# Patient Record
Sex: Male | Born: 1965
Health system: Southern US, Community
[De-identification: ages and names within clinical notes are randomized; demographics above are authoritative.]

## PROBLEM LIST (undated history)

## (undated) DIAGNOSIS — E785 Hyperlipidemia, unspecified: Secondary | ICD-10-CM

## (undated) DIAGNOSIS — I10 Essential (primary) hypertension: Secondary | ICD-10-CM

## (undated) HISTORY — DX: Hyperlipidemia, unspecified: E78.5

## (undated) HISTORY — PX: BACK SURGERY: SHX140

## (undated) HISTORY — PX: KNEE SURGERY: SHX244

## (undated) HISTORY — PX: TONSILLECTOMY: SUR1361

---

## 1998-11-07 ENCOUNTER — Encounter: Payer: Self-pay | Admitting: Neurological Surgery

## 1998-11-11 ENCOUNTER — Ambulatory Visit (HOSPITAL_COMMUNITY): Admission: RE | Admit: 1998-11-11 | Discharge: 1998-11-11 | Payer: Self-pay | Admitting: Neurological Surgery

## 1998-11-11 ENCOUNTER — Encounter: Payer: Self-pay | Admitting: Neurological Surgery

## 2015-08-07 ENCOUNTER — Other Ambulatory Visit: Payer: Self-pay | Admitting: Orthopedic Surgery

## 2015-08-07 DIAGNOSIS — M545 Low back pain: Secondary | ICD-10-CM

## 2015-08-07 DIAGNOSIS — M79605 Pain in left leg: Secondary | ICD-10-CM

## 2015-08-14 ENCOUNTER — Ambulatory Visit
Admission: RE | Admit: 2015-08-14 | Discharge: 2015-08-14 | Disposition: A | Discharge status: Home / Self Care | Payer: 59 | Source: Ambulatory Visit | Attending: Orthopedic Surgery | Admitting: Orthopedic Surgery

## 2015-08-14 DIAGNOSIS — M79605 Pain in left leg: Secondary | ICD-10-CM

## 2015-08-14 DIAGNOSIS — M545 Low back pain: Secondary | ICD-10-CM

## 2018-02-14 DIAGNOSIS — M549 Dorsalgia, unspecified: Secondary | ICD-10-CM | POA: Diagnosis not present

## 2018-07-31 DIAGNOSIS — S43432A Superior glenoid labrum lesion of left shoulder, initial encounter: Secondary | ICD-10-CM | POA: Diagnosis not present

## 2018-10-17 DIAGNOSIS — S43432D Superior glenoid labrum lesion of left shoulder, subsequent encounter: Secondary | ICD-10-CM | POA: Diagnosis not present

## 2018-10-18 ENCOUNTER — Other Ambulatory Visit: Payer: Self-pay | Admitting: Orthopedic Surgery

## 2018-10-18 DIAGNOSIS — Z20828 Contact with and (suspected) exposure to other viral communicable diseases: Secondary | ICD-10-CM | POA: Diagnosis not present

## 2018-10-18 DIAGNOSIS — M25512 Pain in left shoulder: Secondary | ICD-10-CM

## 2018-10-18 DIAGNOSIS — G8929 Other chronic pain: Secondary | ICD-10-CM

## 2018-11-06 ENCOUNTER — Ambulatory Visit
Admission: RE | Admit: 2018-11-06 | Discharge: 2018-11-06 | Disposition: A | Discharge status: Home / Self Care | Payer: Self-pay | Source: Ambulatory Visit | Attending: Orthopedic Surgery | Admitting: Orthopedic Surgery

## 2018-11-06 ENCOUNTER — Ambulatory Visit
Admission: RE | Admit: 2018-11-06 | Discharge: 2018-11-06 | Disposition: A | Discharge status: Home / Self Care | Payer: BLUE CROSS/BLUE SHIELD | Source: Ambulatory Visit | Attending: Orthopedic Surgery | Admitting: Orthopedic Surgery

## 2018-11-06 ENCOUNTER — Other Ambulatory Visit: Payer: Self-pay

## 2018-11-06 DIAGNOSIS — G8929 Other chronic pain: Secondary | ICD-10-CM

## 2018-11-06 DIAGNOSIS — M25512 Pain in left shoulder: Secondary | ICD-10-CM | POA: Diagnosis not present

## 2018-11-06 DIAGNOSIS — S43432A Superior glenoid labrum lesion of left shoulder, initial encounter: Secondary | ICD-10-CM | POA: Diagnosis not present

## 2018-11-06 MED ORDER — IOPAMIDOL (ISOVUE-M 200) INJECTION 41%
15.0000 mL | Freq: Once | INTRAMUSCULAR | Status: AC
  Administered 2018-11-06: 15 mL via INTRA_ARTICULAR

## 2018-11-07 DIAGNOSIS — Z20828 Contact with and (suspected) exposure to other viral communicable diseases: Secondary | ICD-10-CM | POA: Diagnosis not present

## 2018-11-08 DIAGNOSIS — R05 Cough: Secondary | ICD-10-CM | POA: Diagnosis not present

## 2019-01-03 DIAGNOSIS — M9907 Segmental and somatic dysfunction of upper extremity: Secondary | ICD-10-CM | POA: Diagnosis not present

## 2019-01-03 DIAGNOSIS — M47892 Other spondylosis, cervical region: Secondary | ICD-10-CM | POA: Diagnosis not present

## 2019-01-03 DIAGNOSIS — M9902 Segmental and somatic dysfunction of thoracic region: Secondary | ICD-10-CM | POA: Diagnosis not present

## 2019-01-03 DIAGNOSIS — M9901 Segmental and somatic dysfunction of cervical region: Secondary | ICD-10-CM | POA: Diagnosis not present

## 2019-01-08 DIAGNOSIS — M9902 Segmental and somatic dysfunction of thoracic region: Secondary | ICD-10-CM | POA: Diagnosis not present

## 2019-01-08 DIAGNOSIS — M9901 Segmental and somatic dysfunction of cervical region: Secondary | ICD-10-CM | POA: Diagnosis not present

## 2019-01-08 DIAGNOSIS — M9907 Segmental and somatic dysfunction of upper extremity: Secondary | ICD-10-CM | POA: Diagnosis not present

## 2019-01-08 DIAGNOSIS — M47892 Other spondylosis, cervical region: Secondary | ICD-10-CM | POA: Diagnosis not present

## 2019-01-16 DIAGNOSIS — M9907 Segmental and somatic dysfunction of upper extremity: Secondary | ICD-10-CM | POA: Diagnosis not present

## 2019-01-16 DIAGNOSIS — M9902 Segmental and somatic dysfunction of thoracic region: Secondary | ICD-10-CM | POA: Diagnosis not present

## 2019-01-16 DIAGNOSIS — M47892 Other spondylosis, cervical region: Secondary | ICD-10-CM | POA: Diagnosis not present

## 2019-01-16 DIAGNOSIS — M9901 Segmental and somatic dysfunction of cervical region: Secondary | ICD-10-CM | POA: Diagnosis not present

## 2019-01-22 DIAGNOSIS — M9907 Segmental and somatic dysfunction of upper extremity: Secondary | ICD-10-CM | POA: Diagnosis not present

## 2019-01-22 DIAGNOSIS — M9902 Segmental and somatic dysfunction of thoracic region: Secondary | ICD-10-CM | POA: Diagnosis not present

## 2019-01-22 DIAGNOSIS — M47892 Other spondylosis, cervical region: Secondary | ICD-10-CM | POA: Diagnosis not present

## 2019-01-22 DIAGNOSIS — M9901 Segmental and somatic dysfunction of cervical region: Secondary | ICD-10-CM | POA: Diagnosis not present

## 2019-01-29 DIAGNOSIS — M9907 Segmental and somatic dysfunction of upper extremity: Secondary | ICD-10-CM | POA: Diagnosis not present

## 2019-01-29 DIAGNOSIS — M47892 Other spondylosis, cervical region: Secondary | ICD-10-CM | POA: Diagnosis not present

## 2019-01-29 DIAGNOSIS — M9901 Segmental and somatic dysfunction of cervical region: Secondary | ICD-10-CM | POA: Diagnosis not present

## 2019-01-29 DIAGNOSIS — M9902 Segmental and somatic dysfunction of thoracic region: Secondary | ICD-10-CM | POA: Diagnosis not present

## 2019-03-01 DIAGNOSIS — Z03818 Encounter for observation for suspected exposure to other biological agents ruled out: Secondary | ICD-10-CM | POA: Diagnosis not present

## 2019-03-10 ENCOUNTER — Other Ambulatory Visit: Payer: Self-pay

## 2019-03-10 ENCOUNTER — Encounter (HOSPITAL_COMMUNITY): Payer: Self-pay | Admitting: Emergency Medicine

## 2019-03-10 ENCOUNTER — Emergency Department (HOSPITAL_COMMUNITY)
Admission: EM | Admit: 2019-03-10 | Discharge: 2019-03-10 | Disposition: A | Discharge status: Home / Self Care | Payer: BC Managed Care – PPO | Attending: Emergency Medicine | Admitting: Emergency Medicine

## 2019-03-10 DIAGNOSIS — I10 Essential (primary) hypertension: Secondary | ICD-10-CM | POA: Insufficient documentation

## 2019-03-10 LAB — CBC WITH DIFFERENTIAL/PLATELET
Abs Immature Granulocytes: 0.01 10*3/uL (ref 0.00–0.07)
Basophils Absolute: 0 10*3/uL (ref 0.0–0.1)
Basophils Relative: 0 %
Eosinophils Absolute: 0.1 10*3/uL (ref 0.0–0.5)
Eosinophils Relative: 2 %
HCT: 43.4 % (ref 39.0–52.0)
Hemoglobin: 14.9 g/dL (ref 13.0–17.0)
Immature Granulocytes: 0 %
Lymphocytes Relative: 29 %
Lymphs Abs: 1.6 10*3/uL (ref 0.7–4.0)
MCH: 29.4 pg (ref 26.0–34.0)
MCHC: 34.3 g/dL (ref 30.0–36.0)
MCV: 85.6 fL (ref 80.0–100.0)
Monocytes Absolute: 0.6 10*3/uL (ref 0.1–1.0)
Monocytes Relative: 11 %
Neutro Abs: 3.2 10*3/uL (ref 1.7–7.7)
Neutrophils Relative %: 58 %
Platelets: 253 10*3/uL (ref 150–400)
RBC: 5.07 MIL/uL (ref 4.22–5.81)
RDW: 12.1 % (ref 11.5–15.5)
WBC: 5.4 10*3/uL (ref 4.0–10.5)
nRBC: 0 % (ref 0.0–0.2)

## 2019-03-10 LAB — COMPREHENSIVE METABOLIC PANEL
ALT: 20 U/L (ref 0–44)
AST: 17 U/L (ref 15–41)
Albumin: 4.7 g/dL (ref 3.5–5.0)
Alkaline Phosphatase: 59 U/L (ref 38–126)
Anion gap: 8 (ref 5–15)
BUN: 17 mg/dL (ref 6–20)
CO2: 27 mmol/L (ref 22–32)
Calcium: 9.4 mg/dL (ref 8.9–10.3)
Chloride: 104 mmol/L (ref 98–111)
Creatinine, Ser: 0.87 mg/dL (ref 0.61–1.24)
GFR calc Af Amer: >60 mL/min (ref >60)
GFR calc non Af Amer: >60 mL/min (ref >60)
Glucose, Bld: 101 mg/dL — ABNORMAL HIGH (ref 70–99)
Potassium: 4.2 mmol/L (ref 3.5–5.1)
Sodium: 139 mmol/L (ref 135–145)
Total Bilirubin: 0.4 mg/dL (ref 0.3–1.2)
Total Protein: 7.5 g/dL (ref 6.5–8.1)

## 2019-03-10 LAB — URINALYSIS, ROUTINE W REFLEX MICROSCOPIC
Bilirubin Urine: NEGATIVE
Glucose, UA: NEGATIVE mg/dL
Hgb urine dipstick: NEGATIVE
Ketones, ur: NEGATIVE mg/dL
Leukocytes,Ua: NEGATIVE
Nitrite: NEGATIVE
Protein, ur: NEGATIVE mg/dL
Specific Gravity, Urine: 1.023 (ref 1.005–1.030)
pH: 6 (ref 5.0–8.0)

## 2019-03-10 MED ORDER — AMLODIPINE BESYLATE 5 MG PO TABS: 5.0000 mg | ORAL_TABLET | Freq: Every day | ORAL | 1 refills | Status: DC

## 2019-03-10 NOTE — ED Provider Notes (Signed)
[facility] DEPT Provider Note   CSN: [id number] Arrival date & time: 03/10/19  1716     History Chief Complaint  Patient presents with  . Hypertension    Brent Reid is a 54 y.o. male.  HPI  54 year old male with hypertension.  He has had vague sensation of feeling well over the past 2 days to his blood pressure at home.  Numerous high readings up to 190s over 90s.  He reports increased stress over significant health issues with his wife.  He has been told in the past that his blood pressure was "borderline."  He has never been on History reviewed. No pertinent past medical history.  There are no problems to display for this patient.  History reviewed. No pertinent surgical history.    No family history on file.  Social History   Tobacco Use  . Smoking status: Not on file  Substance Use Topics  . Alcohol use: Not on file  . Drug use: Not on file   Home Medications Prior to Admission medications   Not on File   Allergies    Patient has no known allergies.  Review of Systems   Review of Systems All systems reviewed and negative, other than as noted in HPI.  Physical Exam Updated Vital Signs BP (!) 143/99   Pulse 82   Temp 98.5 F (36.9 C)   Resp 20   SpO2 98%   Physical Exam Vitals and nursing note reviewed.  Constitutional:      General: He is not in acute distress.    Appearance: He is well-developed.  HENT:     Head: Normocephalic and atraumatic.  Eyes:     General:        Right eye: No discharge.        Left eye: No discharge.     Conjunctiva/sclera: Conjunctivae normal.  Cardiovascular:     Rate and Rhythm: Normal rate and regular rhythm.     Heart sounds: Normal heart sounds. No murmur. No friction rub. No gallop.   Pulmonary:     Effort: Pulmonary effort is normal. No respiratory distress.     Breath sounds: Normal breath sounds.  Abdominal:     General: There is no distension.     Palpations: Abdomen  is soft.     Tenderness: There is no abdominal tenderness.  Musculoskeletal:        General: No tenderness.     Cervical back: Neck supple.  Skin:    General: Skin is warm and dry.  Neurological:     Mental Status: He is alert.  Psychiatric:        Behavior: Behavior normal.        Thought Content: Thought content normal.     ED Results / Procedures / Treatments   Labs (all labs ordered are listed, but only abnormal results are displayed) Labs Reviewed  COMPREHENSIVE METABOLIC PANEL - Abnormal; Notable for the following components:      Result Value   Glucose, Bld 101 (*)    All other components within normal limits  URINALYSIS, ROUTINE W REFLEX MICROSCOPIC - Abnormal; Notable for the following components:   APPearance CLOUDY (*)    All other components within normal limits  CBC WITH DIFFERENTIAL/PLATELET   EKG None  Radiology No results found.  Procedures Procedures (including critical care time)  Medications Ordered in ED Medications - No data to display  ED Course  I have reviewed the triage vital  signs and the nursing notes.  Pertinent labs & imaging results that were available during my care of the patient were reviewed by me and considered in my medical decision making (see chart for details).    MDM Rules/Calculators/A&P                     54 year old male with what is probably essential hypertension.  Essentially symptomatic from it.  He has a lot of questions but they are really more in line of health maintenance.  Needs to establish with a PCP.  Will start on a low-dose of Norvasc for now.    Final Clinical Impression(s) / ED Diagnoses Final diagnoses:  Hypertension, unspecified type    Rx / DC Orders ED Discharge Orders    None       Raeford Razor, MD 03/13/19 [phone]

## 2019-03-10 NOTE — ED Triage Notes (Signed)
Patient reports head pressure x2 days. States he took BP at home with high readings (192/82). BP 143/99 in triage.

## 2019-03-12 ENCOUNTER — Emergency Department (HOSPITAL_COMMUNITY)
Admission: EM | Admit: 2019-03-12 | Discharge: 2019-03-12 | Disposition: A | Discharge status: Home / Self Care | Payer: BC Managed Care – PPO | Attending: Emergency Medicine | Admitting: Emergency Medicine

## 2019-03-12 ENCOUNTER — Other Ambulatory Visit: Payer: Self-pay

## 2019-03-12 ENCOUNTER — Encounter (HOSPITAL_COMMUNITY): Payer: Self-pay

## 2019-03-12 ENCOUNTER — Emergency Department (HOSPITAL_COMMUNITY): Payer: BC Managed Care – PPO

## 2019-03-12 DIAGNOSIS — R0789 Other chest pain: Secondary | ICD-10-CM | POA: Insufficient documentation

## 2019-03-12 DIAGNOSIS — R079 Chest pain, unspecified: Secondary | ICD-10-CM | POA: Diagnosis not present

## 2019-03-12 DIAGNOSIS — I160 Hypertensive urgency: Secondary | ICD-10-CM | POA: Diagnosis not present

## 2019-03-12 DIAGNOSIS — R519 Headache, unspecified: Secondary | ICD-10-CM | POA: Insufficient documentation

## 2019-03-12 DIAGNOSIS — R270 Ataxia, unspecified: Secondary | ICD-10-CM | POA: Diagnosis not present

## 2019-03-12 DIAGNOSIS — I10 Essential (primary) hypertension: Secondary | ICD-10-CM | POA: Diagnosis not present

## 2019-03-12 HISTORY — DX: Essential (primary) hypertension: I10

## 2019-03-12 LAB — CBC
HCT: 43.8 % (ref 39.0–52.0)
Hemoglobin: 15 g/dL (ref 13.0–17.0)
MCH: 29.4 pg (ref 26.0–34.0)
MCHC: 34.2 g/dL (ref 30.0–36.0)
MCV: 85.9 fL (ref 80.0–100.0)
Platelets: 236 10*3/uL (ref 150–400)
RBC: 5.1 MIL/uL (ref 4.22–5.81)
RDW: 12.2 % (ref 11.5–15.5)
WBC: 4.2 10*3/uL (ref 4.0–10.5)
nRBC: 0 % (ref 0.0–0.2)

## 2019-03-12 LAB — BASIC METABOLIC PANEL
Anion gap: 8 (ref 5–15)
BUN: 13 mg/dL (ref 6–20)
CO2: 27 mmol/L (ref 22–32)
Calcium: 9.4 mg/dL (ref 8.9–10.3)
Chloride: 105 mmol/L (ref 98–111)
Creatinine, Ser: 0.83 mg/dL (ref 0.61–1.24)
GFR calc Af Amer: >60 mL/min (ref >60)
GFR calc non Af Amer: >60 mL/min (ref >60)
Glucose, Bld: 105 mg/dL — ABNORMAL HIGH (ref 70–99)
Potassium: 4.1 mmol/L (ref 3.5–5.1)
Sodium: 140 mmol/L (ref 135–145)

## 2019-03-12 LAB — TROPONIN I (HIGH SENSITIVITY): Troponin I (High Sensitivity): 3 ng/L (ref <18)

## 2019-03-12 LAB — TSH: TSH: 1.477 u[IU]/mL (ref 0.350–4.500)

## 2019-03-12 MED ORDER — SODIUM CHLORIDE 0.9% FLUSH: 3.0000 mL | Freq: Once | INTRAVENOUS | Status: DC

## 2019-03-12 NOTE — ED Provider Notes (Signed)
Blood pressure (!) 145/91, pulse 72, temperature 98 F (36.7 C), temperature source Oral, resp. rate 16, height 6\' 3"  (1.905 m), weight 102.1 kg, SpO2 100 %.  Assuming care from Dr. .  In short, Brent Reid is a 54 y.o. male with a chief complaint of Chest Pain and Hypertension .  Refer to the original H&P for additional details.  The current plan of care is to f/u on CT head and TSH. Anticipate discharge with Cardiology follow up plan Referral placed by Dr. 44.   06:34 PM   CT head and TSH have resulted with no acute findings.  Discussed the findings with the patient and placed an ambulatory referral to cardiology.  Patient understands the follow-up plan and ED return precautions.    Judd Lien, MD 03/12/19 [phone]

## 2019-03-12 NOTE — ED Notes (Signed)
Pt verbalizes understanding of DC instructions. Pt belongings returned and is ambulatory out of ED.  

## 2019-03-12 NOTE — ED Notes (Signed)
This RN called CT to see ETA for exam

## 2019-03-12 NOTE — ED Triage Notes (Signed)
Patient reports intermittent chest pain and hypertension x 1 week. Patient reports his BP at home was 215/125. BP in triage- 153/101. Patient was seen recently for hypertension and ws started on new HTN meds.

## 2019-03-12 NOTE — Discharge Instructions (Addendum)
Continue medication as previously prescribed.  Keep a record of your blood pressures at home and take this with you to your next doctor's appointment.  A referral has been placed for you to see cardiology.  You can call the clinic tomorrow to follow-up on these arrangements.  Their contact information has been provided in this discharge summary.  Return to the emergency department if your symptoms significantly worsen or change.

## 2019-03-12 NOTE — ED Notes (Signed)
Pt transported to CT ?

## 2019-03-12 NOTE — ED Notes (Signed)
This RN called CT to see ETA for exam  

## 2019-03-12 NOTE — ED Provider Notes (Signed)
[facility] DEPT Provider Note   CSN: [id number] Arrival date & time: 03/12/19  1150     History Chief Complaint  Patient presents with  . Chest Pain  . Hypertension    Brent Reid is a 54 y.o. male.  Patient is a 54 year old male presenting with complaints of elevated blood pressure, pressure behind his eyes, fatigue, and feeling generally unwell for the past week.  Patient seen here 2 days ago with similar complaints.  Work-up did not show was unremarkable and the patient was discharged to home.  He was started on Norvasc which she has had 2 doses of.  His blood pressure at home today was 215/125 and he reports developing the pressure behind his eyes again.  He presents for evaluation of this.  He denies chest pain, but does describe some discomfort in his left shoulder and arm.  He also has some discomfort in his lower back.  He denies any fevers or chills.  He denies any aggravating or alleviating factors.  The history is provided by the patient.       Past Medical History:  Diagnosis Date  . Hypertension     There are no problems to display for this patient.   Past Surgical History:  Procedure Laterality Date  . BACK SURGERY    . KNEE SURGERY         Family History  Problem Relation Age of Onset  . Stroke Mother   . Heart failure Father     Social History   Tobacco Use  . Smoking status: Never Smoker  . Smokeless tobacco: Never Used  Substance Use Topics  . Alcohol use: Yes  . Drug use: Never    Home Medications Prior to Admission medications   Medication Sig Start Date End Date Taking? Authorizing Provider  amLODipine (NORVASC) 5 MG tablet Take 1 tablet (5 mg total) by mouth daily. 03/10/19   Virgel Manifold, MD    Allergies    Patient has no allergy information on record.  Review of Systems   Review of Systems  All other systems reviewed and are negative.   Physical Exam Updated Vital Signs BP (!) 153/101  (BP Location: Left Arm)   Pulse 72   Temp 98 F (36.7 C) (Oral)   Resp 16   Ht 6\' 3"  (1.905 m)   Wt 102.1 kg   SpO2 100%   BMI 28.12 kg/m   Physical Exam Vitals and nursing note reviewed.  Constitutional:      General: He is not in acute distress.    Appearance: He is well-developed. He is not diaphoretic.  HENT:     Head: Normocephalic and atraumatic.  Eyes:     Extraocular Movements: Extraocular movements intact.     Pupils: Pupils are equal, round, and reactive to light.  Cardiovascular:     Rate and Rhythm: Normal rate and regular rhythm.     Heart sounds: No murmur. No friction rub.  Pulmonary:     Effort: Pulmonary effort is normal. No respiratory distress.     Breath sounds: Normal breath sounds. No wheezing or rales.  Abdominal:     General: Bowel sounds are normal. There is no distension.     Palpations: Abdomen is soft.     Tenderness: There is no abdominal tenderness.  Musculoskeletal:        General: Normal range of motion.     Cervical back: Normal range of motion and neck supple.  Right lower leg: No tenderness. No edema.     Left lower leg: No tenderness. No edema.  Skin:    General: Skin is warm and dry.  Neurological:     General: No focal deficit present.     Mental Status: He is alert and oriented to person, place, and time.     Cranial Nerves: No cranial nerve deficit.     Motor: No weakness.     Coordination: Coordination normal.     ED Results / Procedures / Treatments   Labs (all labs ordered are listed, but only abnormal results are displayed) Labs Reviewed  BASIC METABOLIC PANEL - Abnormal; Notable for the following components:      Result Value   Glucose, Bld 105 (*)    All other components within normal limits  CBC  TSH  TROPONIN I (HIGH SENSITIVITY)    EKG EKG Interpretation  Date/Time:  Monday March 12 2019 12:11:36 EST Ventricular Rate:  69 PR Interval:    QRS Duration: 89 QT Interval:  368 QTC Calculation: 395 R  Axis:   68 Text Interpretation: Sinus rhythm Normal ECG Confirmed by Geoffery Lyons ([id number]) on 03/12/2019 12:26:59 PM   Radiology DG Chest 2 View  Result Date: 03/12/2019 CLINICAL DATA:  Chest pain, hypertension for 1 week EXAM: CHEST - 2 VIEW COMPARISON:  None. FINDINGS: The heart size and mediastinal contours are within normal limits. Both lungs are clear. The visualized skeletal structures are unremarkable. IMPRESSION: No active cardiopulmonary disease. Electronically Signed   By: Elige Ko   On: 03/12/2019 14:03    Procedures Procedures (including critical care time)  Medications Ordered in ED Medications - No data to display  ED Course  I have reviewed the triage vital signs and the nursing notes.  Pertinent labs & imaging results that were available during my care of the patient were reviewed by me and considered in my medical decision making (see chart for details).    MDM Rules/Calculators/A&P  Patient presenting here with complaints of elevated blood pressure, pressure in his head, and discomfort in his left shoulder and arm.  His blood pressure was markedly elevated at home this afternoon and presents for evaluation of this.  His blood pressures have improved significantly here in the ER.  Patient's work-up today shows unremarkable laboratory studies and EKG which is unchanged.  Chest x-ray is clear.  As the patient is complaining of ongoing headaches, a CT scan will be obtained to rule out intracranial pathology.  If the CT scan is negative, I anticipate discharge with outpatient follow-up.  Care to be signed out to Dr. Jacqulyn Bath at shift change.  He will obtain the results of the CT scan and determine the final disposition.  It sounds as though patient has had significant stressors in his life including his wife having stage 4 cancer.  This may play a role in his headaches and elevated blood pressure, however I do feel as though a follow-up appointment with cardiology would be  appropriate to discuss his blood pressures, possible echocardiogram, and possible stress testing.  Final Clinical Impression(s) / ED Diagnoses Final diagnoses:  None    Rx / DC Orders ED Discharge Orders    None       Geoffery Lyons, MD 03/12/19 1510

## 2019-03-16 ENCOUNTER — Encounter: Payer: Self-pay | Admitting: Family Medicine

## 2019-03-16 ENCOUNTER — Other Ambulatory Visit: Payer: Self-pay

## 2019-03-16 ENCOUNTER — Ambulatory Visit (INDEPENDENT_AMBULATORY_CARE_PROVIDER_SITE_OTHER): Payer: BC Managed Care – PPO | Admitting: Family Medicine

## 2019-03-16 VITALS — BP 124/86 | HR 77 | Temp 97.0°F | Ht 75.0 in | Wt 231.2 lb

## 2019-03-16 DIAGNOSIS — E559 Vitamin D deficiency, unspecified: Secondary | ICD-10-CM

## 2019-03-16 DIAGNOSIS — Z0001 Encounter for general adult medical examination with abnormal findings: Secondary | ICD-10-CM | POA: Diagnosis not present

## 2019-03-16 DIAGNOSIS — F439 Reaction to severe stress, unspecified: Secondary | ICD-10-CM

## 2019-03-16 DIAGNOSIS — Z6828 Body mass index (BMI) 28.0-28.9, adult: Secondary | ICD-10-CM

## 2019-03-16 DIAGNOSIS — I10 Essential (primary) hypertension: Secondary | ICD-10-CM | POA: Diagnosis not present

## 2019-03-16 DIAGNOSIS — E785 Hyperlipidemia, unspecified: Secondary | ICD-10-CM

## 2019-03-16 MED ORDER — AMLODIPINE BESYLATE 5 MG PO TABS: 5.0000 mg | ORAL_TABLET | Freq: Every day | ORAL | 3 refills | Status: DC

## 2019-03-16 NOTE — Patient Instructions (Signed)
It was very nice to see you today!  Keep an eye on your BP and let me know if persistently 140/90 or higher.  Come back in 3-6 months for a recheck.  Take care, Dr Jerline Pain  Please try these tips to maintain a healthy lifestyle:   Eat at least 3 REAL meals and 1-2 snacks per day.  Aim for no more than 5 hours between eating.  If you eat breakfast, please do so within one hour of getting up.    Each meal should contain half fruits/vegetables, one quarter protein, and one quarter carbs (no bigger than a computer mouse)   Cut down on sweet beverages. This includes juice, soda, and sweet tea.     Drink at least 1 glass of water with each meal and aim for at least 8 glasses per day   Exercise at least 150 minutes every week.    Preventive Care 90-9 Years Old, Male Preventive care refers to lifestyle choices and visits with your health care provider that can promote health and wellness. This includes:  A yearly physical exam. This is also called an annual well check.  Regular dental and eye exams.  Immunizations.  Screening for certain conditions.  Healthy lifestyle choices, such as eating a healthy diet, getting regular exercise, not using drugs or products that contain nicotine and tobacco, and limiting alcohol use. What can I expect for my preventive care visit? Physical exam Your health care provider will check:  Height and weight. These may be used to calculate body mass index (BMI), which is a measurement that tells if you are at a healthy weight.  Heart rate and blood pressure.  Your skin for abnormal spots. Counseling Your health care provider may ask you questions about:  Alcohol, tobacco, and drug use.  Emotional well-being.  Home and relationship well-being.  Sexual activity.  Eating habits.  Work and work Statistician. What immunizations do I need?  Influenza (flu) vaccine  This is recommended every year. Tetanus, diphtheria, and pertussis  (Tdap) vaccine  You may need a Td booster every 10 years. Varicella (chickenpox) vaccine  You may need this vaccine if you have not already been vaccinated. Zoster (shingles) vaccine  You may need this after age 32. Measles, mumps, and rubella (MMR) vaccine  You may need at least one dose of MMR if you were born in 1957 or later. You may also need a second dose. Pneumococcal conjugate (PCV13) vaccine  You may need this if you have certain conditions and were not previously vaccinated. Pneumococcal polysaccharide (PPSV23) vaccine  You may need one or two doses if you smoke cigarettes or if you have certain conditions. Meningococcal conjugate (MenACWY) vaccine  You may need this if you have certain conditions. Hepatitis A vaccine  You may need this if you have certain conditions or if you travel or work in places where you may be exposed to hepatitis A. Hepatitis B vaccine  You may need this if you have certain conditions or if you travel or work in places where you may be exposed to hepatitis B. Haemophilus influenzae type b (Hib) vaccine  You may need this if you have certain risk factors. Human papillomavirus (HPV) vaccine  If recommended by your health care provider, you may need three doses over 6 months. You may receive vaccines as individual doses or as more than one vaccine together in one shot (combination vaccines). Talk with your health care provider about the risks and benefits of combination vaccines.  What tests do I need? Blood tests  Lipid and cholesterol levels. These may be checked every 5 years, or more frequently if you are over 15 years old.  Hepatitis C test.  Hepatitis B test. Screening  Lung cancer screening. You may have this screening every year starting at age 53 if you have a 30-pack-year history of smoking and currently smoke or have quit within the past 15 years.  Prostate cancer screening. Recommendations will vary depending on your family  history and other risks.  Colorectal cancer screening. All adults should have this screening starting at age 38 and continuing until age 68. Your health care provider may recommend screening at age 29 if you are at increased risk. You will have tests every 1-10 years, depending on your results and the type of screening test.  Diabetes screening. This is done by checking your blood sugar (glucose) after you have not eaten for a while (fasting). You may have this done every 1-3 years.  Sexually transmitted disease (STD) testing. Follow these instructions at home: Eating and drinking  Eat a diet that includes fresh fruits and vegetables, whole grains, lean protein, and low-fat dairy products.  Take vitamin and mineral supplements as recommended by your health care provider.  Do not drink alcohol if your health care provider tells you not to drink.  If you drink alcohol: ? Limit how much you have to 0-2 drinks a day. ? Be aware of how much alcohol is in your drink. In the [place] one drink equals one 12 oz bottle of beer (355 mL), one 5 oz glass of wine (148 mL), or one 1 oz glass of hard liquor (44 mL). Lifestyle  Take daily care of your teeth and gums.  Stay active. Exercise for at least 30 minutes on 5 or more days each week.  Do not use any products that contain nicotine or tobacco, such as cigarettes, e-cigarettes, and chewing tobacco. If you need help quitting, ask your health care provider.  If you are sexually active, practice safe sex. Use a condom or other form of protection to prevent STIs (sexually transmitted infections).  Talk with your health care provider about taking a low-dose aspirin every day starting at age 34. What's next?  Go to your health care provider once a year for a well check visit.  Ask your health care provider how often you should have your eyes and teeth checked.  Stay up to date on all vaccines. This information is not intended to replace advice  given to you by your health care provider. Make sure you discuss any questions you have with your health care provider. Document Revised: 01/12/2018 Document Reviewed: 01/12/2018 Elsevier Patient Education  2020 [employer].

## 2019-03-16 NOTE — Progress Notes (Signed)
Chief Complaint:  Brent Reid is a 54 y.o. male who presents today for his annual comprehensive physical exam.    Assessment/Plan:  Chronic Problems Addressed Today: Essential hypertension At goal today.  Likely elevated due to increased stress and decreased cardio exercises.  We will continue amlodipine 5 mg daily for the next 2 months.  He will check in with me in 3 to 6 months.  Continue home monitoring goal 140/90 or lower.  Discussed reasons to return to care.  Stress Discussed options including therapy and pharmacotherapy.  Gave pamphlet with information for counselor in our office.  He would like to decline pharmacotherapy at this point.  We will follow-up with him again in 3 months.  Dyslipidemia Will scan most recent labs in the chart.  Had slightly elevated LDL and normal HDL.  He will continue with lifestyle modifications and try to work on cardio.  We will recheck in 1 year.   Body mass index is 28.9 kg/m. / Overweight BMI Metric Follow Up - 03/16/19 1404      BMI Metric Follow Up-Please document annually   BMI Metric Follow Up  Education provided        Preventative Healthcare: Deferred flu vaccine.  PSA within normal limits.  Patient Counseling(The following topics were reviewed and/or handout was given):  -Nutrition: Stressed importance of moderation in sodium/caffeine intake, saturated fat and cholesterol, caloric balance, sufficient intake of fresh fruits, vegetables, and fiber.  -Stressed the importance of regular exercise.   -Substance Abuse: Discussed cessation/primary prevention of tobacco, alcohol, or other drug use; driving or other dangerous activities under the influence; availability of treatment for abuse.   -Injury prevention: Discussed safety belts, safety helmets, smoke detector, smoking near bedding or upholstery.   -Sexuality: Discussed sexually transmitted diseases, partner selection, use of condoms, avoidance of unintended pregnancy and  contraceptive alternatives.   -Dental health: Discussed importance of regular tooth brushing, flossing, and dental visits.  -Health maintenance and immunizations reviewed. Please refer to Health maintenance section.  Return to care in 1 year for next preventative visit.     Subjective:  HPI:  He has no acute complaints today.   Patient was seen in the ED twice within the last week.  He has been under quite a bit of stress recently and was found to have elevated blood pressure reading.  He had extensive work-up there that was negative for cardiac etiology.  He was started on amlodipine 5 mg daily.  He has tolerated well.  He has been under increased rest recently.  Wife has been diagnosed with stage IV breast cancer.  Does not currently have any sort of chest pain or shortness of breath.  Lifestyle Diet: Tries to eat a healthy and balanced diet.  Exercise: Plays competitive volleyball. Works out in home gym.   No flowsheet data found.  Health Maintenance Due  Topic Date Due  . HIV Screening  08/06[birth year]1982     ROS: Per HPI, otherwise a complete review of systems was negative.   PMH:  The following were reviewed and entered/updated in epic: Past Medical History:  Diagnosis Date  . Hypertension    Patient Active Problem List   Diagnosis Date Noted  . Essential hypertension 03/16/2019  . Stress 03/16/2019  . Vitamin D deficiency 03/16/2019  . Dyslipidemia 03/16/2019   Past Surgical History:  Procedure Laterality Date  . BACK SURGERY    . KNEE SURGERY    . TONSILLECTOMY      Family  History  Problem Relation Age of Onset  . Stroke Mother   . Heart failure Father     Medications- reviewed and updated Current Outpatient Medications  Medication Sig Dispense Refill  . amLODipine (NORVASC) 5 MG tablet Take 1 tablet (5 mg total) by mouth daily. 90 tablet 3  . diclofenac (VOLTAREN) 50 MG EC tablet Take 50 mg by mouth 2 (two) times daily as needed for pain.     No current  facility-administered medications for this visit.    Allergies-reviewed and updated No Known Allergies  Social History   Socioeconomic History  . Marital status: Married    Spouse name: Not on file  . Number of children: Not on file  . Years of education: Not on file  . Highest education level: Not on file  Occupational History  . Not on file  Tobacco Use  . Smoking status: Never Smoker  . Smokeless tobacco: Never Used  Substance and Sexual Activity  . Alcohol use: Yes  . Drug use: Never  . Sexual activity: Not on file  Other Topics Concern  . Not on file  Social History Narrative  . Not on file   Social Determinants of Health   Financial Resource Strain:   . Difficulty of Paying Living Expenses: Not on file  Food Insecurity:   . Worried About Programme researcher, broadcasting/film/video in the Last Year: Not on file  . Ran Out of Food in the Last Year: Not on file  Transportation Needs:   . Lack of Transportation (Medical): Not on file  . Lack of Transportation (Non-Medical): Not on file  Physical Activity:   . Days of Exercise per Week: Not on file  . Minutes of Exercise per Session: Not on file  Stress:   . Feeling of Stress : Not on file  Social Connections:   . Frequency of Communication with Friends and Family: Not on file  . Frequency of Social Gatherings with Friends and Family: Not on file  . Attends Religious Services: Not on file  . Active Member of Clubs or Organizations: Not on file  . Attends Banker Meetings: Not on file  . Marital Status: Not on file        Objective:  Physical Exam: BP 124/86   Pulse 77   Temp (!) 97 F (36.1 C)   Ht 6\' 3"  (1.905 m)   Wt 231 lb 3.2 oz (104.9 kg)   SpO2 96%   BMI 28.90 kg/m   Body mass index is 28.9 kg/m. Wt Readings from Last 3 Encounters:  03/16/19 231 lb 3.2 oz (104.9 kg)  03/12/19 225 lb (102.1 kg)  03/10/19 225 lb (102.1 kg)   Gen: NAD, resting comfortably HEENT: TMs normal bilaterally. OP clear. No  thyromegaly noted.  CV: RRR with no murmurs appreciated Pulm: NWOB, CTAB with no crackles, wheezes, or rhonchi GI: Normal bowel sounds present. Soft, Nontender, Nondistended. MSK: no edema, cyanosis, or clubbing noted Skin: warm, dry Neuro: CN2-12 grossly intact. Strength 5/5 in upper and lower extremities. Reflexes symmetric and intact bilaterally.  Psych: Normal affect and thought content     Bronnie Vasseur M. 05/08/19, MD 03/16/2019 2:04 PM

## 2019-03-16 NOTE — Assessment & Plan Note (Signed)
At goal today.  Likely elevated due to increased stress and decreased cardio exercises.  We will continue amlodipine 5 mg daily for the next 2 months.  He will check in with me in 3 to 6 months.  Continue home monitoring goal 140/90 or lower.  Discussed reasons to return to care.

## 2019-03-16 NOTE — Assessment & Plan Note (Signed)
Will scan most recent labs in the chart.  Had slightly elevated LDL and normal HDL.  He will continue with lifestyle modifications and try to work on cardio.  We will recheck in 1 year.

## 2019-03-16 NOTE — Assessment & Plan Note (Signed)
Discussed options including therapy and pharmacotherapy.  Gave pamphlet with information for counselor in our office.  He would like to decline pharmacotherapy at this point.  We will follow-up with him again in 3 months.

## 2019-03-21 NOTE — Progress Notes (Signed)
Daisey Must, MD Reason for referral-hypertension  HPI: 54 year old male for evaluation of hypertension at request of Alona Bene, MD. Patient seen in the emergency room February 8 with complaints of hypertension.  Blood pressure at home reported as 215/125.  Head CT and chest x-ray negative.  TSH normal.  Sodium 140 with potassium 4.1.  BUN 13 and creatinine 0.83.  Patient has significant amounts of stress related to an illness of his wife.  When he was seen in the emergency room he developed increased blood pressure of 215/200 initially.  He denies dyspnea on exertion, orthopnea, PND, pedal edema, exertional chest pain or syncope.  He was placed on amlodipine 5 mg daily.  However he has had what he feels is side effects including flushing sensation and ankle edema.  Cardiology now asked to evaluate.  Current Outpatient Medications  Medication Sig Dispense Refill  . diclofenac (VOLTAREN) 50 MG EC tablet Take 50 mg by mouth 2 (two) times daily as needed for pain.     No current facility-administered medications for this visit.    No Known Allergies   Past Medical History:  Diagnosis Date  . Hyperlipidemia   . Hypertension     Past Surgical History:  Procedure Laterality Date  . BACK SURGERY    . KNEE SURGERY    . TONSILLECTOMY      Social History   Socioeconomic History  . Marital status: Married    Spouse name: Not on file  . Number of children: 1  . Years of education: Not on file  . Highest education level: Not on file  Occupational History    Comment: Real Estate  Tobacco Use  . Smoking status: Never Smoker  . Smokeless tobacco: Never Used  Substance and Sexual Activity  . Alcohol use: Yes    Comment: Occasional  . Drug use: Never  . Sexual activity: Not on file  Other Topics Concern  . Not on file  Social History Narrative  . Not on file   Social Determinants of Health   Financial Resource Strain:   . Difficulty of Paying Living Expenses: Not  on file  Food Insecurity:   . Worried About Programme researcher, broadcasting/film/video in the Last Year: Not on file  . Ran Out of Food in the Last Year: Not on file  Transportation Needs:   . Lack of Transportation (Medical): Not on file  . Lack of Transportation (Non-Medical): Not on file  Physical Activity:   . Days of Exercise per Week: Not on file  . Minutes of Exercise per Session: Not on file  Stress:   . Feeling of Stress : Not on file  Social Connections:   . Frequency of Communication with Friends and Family: Not on file  . Frequency of Social Gatherings with Friends and Family: Not on file  . Attends Religious Services: Not on file  . Active Member of Clubs or Organizations: Not on file  . Attends Banker Meetings: Not on file  . Marital Status: Not on file  Intimate Partner Violence:   . Fear of Current or Ex-Partner: Not on file  . Emotionally Abused: Not on file  . Physically Abused: Not on file  . Sexually Abused: Not on file    Family History  Problem Relation Age of Onset  . Stroke Mother   . Heart failure Father   . CAD Father     ROS: no fevers or chills, productive cough, hemoptysis, dysphasia, odynophagia, melena,  hematochezia, dysuria, hematuria, rash, seizure activity, orthopnea, PND, pedal edema, claudication. Remaining systems are negative.  Physical Exam:   Blood pressure (!) 148/82, pulse 73, height 6\' 3"  (1.905 m), weight 232 lb 6.4 oz (105.4 kg), SpO2 98 %.  General:  Well developed/well nourished in NAD Skin warm/dry Patient not depressed No peripheral clubbing Back-normal HEENT-normal/normal eyelids Neck supple/normal carotid upstroke bilaterally; no bruits; no JVD; no thyromegaly chest - CTA/ normal expansion CV - RRR/normal S1 and S2; no murmurs, rubs or gallops;  PMI nondisplaced Abdomen -NT/ND, no HSM, no mass, + bowel sounds, no bruit 2+ femoral pulses, no bruits Ext-no edema, chords, 2+ DP Neuro-grossly nonfocal  ECG - 03/12/19-sinus  rhythm with no ST changes.  Personally reviewed  A/P  1 hypertension-recent diagnosis of hypertension.  He is not tolerating amlodipine.  We will add losartan 50 mg daily and discontinue amlodipine.  Check potassium and renal function in 1 week.  Further adjustments based on follow-up readings.  2 hyperlipidemia-he had recent lipids drawn and we will review.  Initiate therapy as indicated.  3 family history of coronary artery disease-we will arrange a calcium score for risk stratification.  Kirk Ruths, MD

## 2019-03-23 ENCOUNTER — Telehealth: Payer: Self-pay | Admitting: Family Medicine

## 2019-03-23 NOTE — Telephone Encounter (Signed)
Rx sent patient please check with pharmacy

## 2019-03-23 NOTE — Telephone Encounter (Signed)
MEDICATION: amlodipine 5 MG tablet  PHARMACY: [facility] [address] at [facility] [address]  Comments: pt said he is on his last pill today  **Let patient know to contact pharmacy at the end of the day to make sure medication is ready. **  ** Please notify patient to allow 48-72 hours to process**  **Encourage patient to contact the pharmacy for refills or they can request refills through [facility]**

## 2019-03-27 ENCOUNTER — Encounter: Payer: Self-pay | Admitting: Cardiology

## 2019-03-27 ENCOUNTER — Ambulatory Visit (INDEPENDENT_AMBULATORY_CARE_PROVIDER_SITE_OTHER): Payer: BC Managed Care – PPO | Admitting: Cardiology

## 2019-03-27 ENCOUNTER — Other Ambulatory Visit: Payer: Self-pay

## 2019-03-27 VITALS — BP 148/82 | HR 73 | Ht 75.0 in | Wt 232.4 lb

## 2019-03-27 DIAGNOSIS — I10 Essential (primary) hypertension: Secondary | ICD-10-CM

## 2019-03-27 DIAGNOSIS — E785 Hyperlipidemia, unspecified: Secondary | ICD-10-CM

## 2019-03-27 MED ORDER — LOSARTAN POTASSIUM 50 MG PO TABS: 50.0000 mg | ORAL_TABLET | Freq: Every day | ORAL | 3 refills | Status: DC

## 2019-03-27 NOTE — Patient Instructions (Signed)
Medication Instructions:  STOP AMLODIPINE   START LOSARTAN 50 MG ONCE DAILY  *If you need a refill on your cardiac medications before your next appointment, please call your pharmacy*  Lab Work: Your physician recommends that you return for lab work in: ONE WEEK  If you have labs (blood work) drawn today and your tests are completely normal, you will receive your results only by: Marland Kitchen MyChart Message (if you have MyChart) OR . A paper copy in the mail If you have any lab test that is abnormal or we need to change your treatment, we will call you to review the results.  Testing/Procedures: CORONARY CALCIUM SCORE AT [address]  Follow-Up: At [facility], you and your health needs are our priority.  As part of our continuing mission to provide you with exceptional heart care, we have created designated Provider Care Teams.  These Care Teams include your primary Cardiologist (physician) and Advanced Practice Providers (APPs -  Physician Assistants and Nurse Practitioners) who all work together to provide you with the care you need, when you need it.  Your next appointment:   3 month(s)  The format for your next appointment:   Either In Person or Virtual  Provider:   Olga Millers, MD

## 2019-04-06 DIAGNOSIS — I10 Essential (primary) hypertension: Secondary | ICD-10-CM | POA: Diagnosis not present

## 2019-04-06 LAB — BASIC METABOLIC PANEL
BUN/Creatinine Ratio: 17 (ref 9–20)
BUN: 14 mg/dL (ref 6–24)
CO2: 24 mmol/L (ref 20–29)
Calcium: 9.8 mg/dL (ref 8.7–10.2)
Chloride: 101 mmol/L (ref 96–106)
Creatinine, Ser: 0.83 mg/dL (ref 0.76–1.27)
GFR calc Af Amer: 118 mL/min/{1.73_m2} (ref >59)
GFR calc non Af Amer: 102 mL/min/{1.73_m2} (ref >59)
Glucose: 87 mg/dL (ref 65–99)
Potassium: 4.6 mmol/L (ref 3.5–5.2)
Sodium: 139 mmol/L (ref 134–144)

## 2019-04-12 ENCOUNTER — Inpatient Hospital Stay: Admission: RE | Admit: 2019-04-12 | Payer: BC Managed Care – PPO | Source: Ambulatory Visit

## 2019-04-12 DIAGNOSIS — Z20828 Contact with and (suspected) exposure to other viral communicable diseases: Secondary | ICD-10-CM | POA: Diagnosis not present

## 2019-04-12 DIAGNOSIS — Z03818 Encounter for observation for suspected exposure to other biological agents ruled out: Secondary | ICD-10-CM | POA: Diagnosis not present

## 2019-04-19 ENCOUNTER — Ambulatory Visit (INDEPENDENT_AMBULATORY_CARE_PROVIDER_SITE_OTHER)
Admission: RE | Admit: 2019-04-19 | Discharge: 2019-04-19 | Disposition: A | Discharge status: Home / Self Care | Payer: Self-pay | Source: Ambulatory Visit | Attending: Cardiology | Admitting: Cardiology

## 2019-04-19 ENCOUNTER — Telehealth: Payer: Self-pay | Admitting: *Deleted

## 2019-04-19 ENCOUNTER — Other Ambulatory Visit: Payer: Self-pay

## 2019-04-19 DIAGNOSIS — I10 Essential (primary) hypertension: Secondary | ICD-10-CM

## 2019-04-19 NOTE — Telephone Encounter (Signed)
DC losartan; HCTZ 25 mg daily; bmet one week Olga Millers

## 2019-04-19 NOTE — Telephone Encounter (Addendum)
Spoke with pt, Aware of dr Ludwig Clarks recommendations. He is very apprehensive about starting cholesterol medications. He would like to work on his cholesterol numbers by natural ways before going on any medications at this time. He is reporting he is not taking the losartan at this time because of side effects, low back pain and swelling in feet. He is getting consistent numbers in the high 130's and high 80's. He would like to know if there is another type of blood pressure medication he can try for his bp. He reports he still does not feel right. Will forward for dr Jens Som review   ----- Message from Lewayne Bunting, MD sent at 04/19/2019  2:24 PM EDT ----- Ca score mildly elevated; given risk factors would begin crestor 20 mg daily; lipids and liver 12 weeks Olga Millers

## 2019-04-20 MED ORDER — HYDROCHLOROTHIAZIDE 25 MG PO TABS: 25.0000 mg | ORAL_TABLET | Freq: Every day | ORAL | 3 refills | Status: DC

## 2019-04-20 NOTE — Telephone Encounter (Signed)
Spoke with pt, Aware of dr crenshaw's recommendations.  Lab orders mailed to the pt  

## 2019-04-23 ENCOUNTER — Ambulatory Visit: Payer: BLUE CROSS/BLUE SHIELD | Admitting: Family Medicine

## 2019-04-27 DIAGNOSIS — I10 Essential (primary) hypertension: Secondary | ICD-10-CM | POA: Diagnosis not present

## 2019-04-28 LAB — BASIC METABOLIC PANEL
BUN/Creatinine Ratio: 20 (ref 9–20)
BUN: 17 mg/dL (ref 6–24)
CO2: 25 mmol/L (ref 20–29)
Calcium: 9.9 mg/dL (ref 8.7–10.2)
Chloride: 96 mmol/L (ref 96–106)
Creatinine, Ser: 0.86 mg/dL (ref 0.76–1.27)
GFR calc Af Amer: 116 mL/min/{1.73_m2} (ref >59)
GFR calc non Af Amer: 100 mL/min/{1.73_m2} (ref >59)
Glucose: 105 mg/dL — ABNORMAL HIGH (ref 65–99)
Potassium: 3.8 mmol/L (ref 3.5–5.2)
Sodium: 138 mmol/L (ref 134–144)

## 2019-05-04 DIAGNOSIS — Z20828 Contact with and (suspected) exposure to other viral communicable diseases: Secondary | ICD-10-CM | POA: Diagnosis not present

## 2019-06-19 NOTE — Progress Notes (Deleted)
HPI: FU hypertension. Patient seen in the emergency room 2/21 with complaints of hypertension.  Blood pressure at home reported as 215/125.  Head CT and chest x-ray negative.  TSH normal.  Sodium 140 with potassium 4.1.  BUN 13 and creatinine 0.83. Calcium score March 2021 4.24.  Patient did not tolerate amlodipine.  Losartan added at last office visit.  Since last seen,   Current Outpatient Medications  Medication Sig Dispense Refill  . diclofenac (VOLTAREN) 50 MG EC tablet Take 50 mg by mouth 2 (two) times daily as needed for pain.    . hydrochlorothiazide (HYDRODIURIL) 25 MG tablet Take 1 tablet (25 mg total) by mouth daily. 90 tablet 3   No current facility-administered medications for this visit.     Past Medical History:  Diagnosis Date  . Hyperlipidemia   . Hypertension     Past Surgical History:  Procedure Laterality Date  . BACK SURGERY    . KNEE SURGERY    . TONSILLECTOMY      Social History   Socioeconomic History  . Marital status: Married    Spouse name: Not on file  . Number of children: 1  . Years of education: Not on file  . Highest education level: Not on file  Occupational History    Comment: Real Estate  Tobacco Use  . Smoking status: Never Smoker  . Smokeless tobacco: Never Used  Substance and Sexual Activity  . Alcohol use: Yes    Comment: Occasional  . Drug use: Never  . Sexual activity: Not on file  Other Topics Concern  . Not on file  Social History Narrative  . Not on file   Social Determinants of Health   Financial Resource Strain:   . Difficulty of Paying Living Expenses:   Food Insecurity:   . Worried About Programme researcher, broadcasting/film/video in the Last Year:   . Barista in the Last Year:   Transportation Needs:   . Freight forwarder (Medical):   Marland Kitchen Lack of Transportation (Non-Medical):   Physical Activity:   . Days of Exercise per Week:   . Minutes of Exercise per Session:   Stress:   . Feeling of Stress :   Social  Connections:   . Frequency of Communication with Friends and Family:   . Frequency of Social Gatherings with Friends and Family:   . Attends Religious Services:   . Active Member of Clubs or Organizations:   . Attends Banker Meetings:   Marland Kitchen Marital Status:   Intimate Partner Violence:   . Fear of Current or Ex-Partner:   . Emotionally Abused:   Marland Kitchen Physically Abused:   . Sexually Abused:     Family History  Problem Relation Age of Onset  . Stroke Mother   . Heart failure Father   . CAD Father     ROS: no fevers or chills, productive cough, hemoptysis, dysphasia, odynophagia, melena, hematochezia, dysuria, hematuria, rash, seizure activity, orthopnea, PND, pedal edema, claudication. Remaining systems are negative.  Physical Exam: Well-developed well-nourished in no acute distress.  Skin is warm and dry.  HEENT is normal.  Neck is supple.  Chest is clear to auscultation with normal expansion.  Cardiovascular exam is regular rate and rhythm.  Abdominal exam nontender or distended. No masses palpated. Extremities show no edema. neuro grossly intact  ECG- personally reviewed  A/P  1 hypertension-patient did not tolerate amlodipine.  2 hyperlipidemia-  Olga Millers, MD

## 2019-06-26 ENCOUNTER — Ambulatory Visit: Payer: BC Managed Care – PPO | Admitting: Cardiology

## 2019-07-04 DIAGNOSIS — S43432D Superior glenoid labrum lesion of left shoulder, subsequent encounter: Secondary | ICD-10-CM | POA: Diagnosis not present

## 2019-08-16 ENCOUNTER — Ambulatory Visit: Payer: BC Managed Care – PPO | Admitting: Family Medicine

## 2019-08-20 ENCOUNTER — Ambulatory Visit: Payer: BC Managed Care – PPO | Admitting: Family Medicine

## 2019-08-20 DIAGNOSIS — Z0289 Encounter for other administrative examinations: Secondary | ICD-10-CM

## 2019-08-24 ENCOUNTER — Other Ambulatory Visit: Payer: Self-pay

## 2019-08-24 ENCOUNTER — Ambulatory Visit (INDEPENDENT_AMBULATORY_CARE_PROVIDER_SITE_OTHER): Payer: BC Managed Care – PPO | Admitting: Family Medicine

## 2019-08-24 ENCOUNTER — Encounter: Payer: Self-pay | Admitting: Family Medicine

## 2019-08-24 VITALS — BP 127/75 | HR 71 | Temp 97.6°F | Ht 75.0 in | Wt 199.4 lb

## 2019-08-24 DIAGNOSIS — Z1211 Encounter for screening for malignant neoplasm of colon: Secondary | ICD-10-CM | POA: Diagnosis not present

## 2019-08-24 DIAGNOSIS — E785 Hyperlipidemia, unspecified: Secondary | ICD-10-CM

## 2019-08-24 DIAGNOSIS — I10 Essential (primary) hypertension: Secondary | ICD-10-CM | POA: Diagnosis not present

## 2019-08-24 NOTE — Progress Notes (Signed)
   Brent Reid is a 54 y.o. male who presents today for an office visit.  Assessment/Plan:  Chronic Problems Addressed Today: Dyslipidemia Congratulated patient on weight loss.  Recheck lipids next year.  Continue lifestyle modifications.  Essential hypertension At goal today.  He has been working very hard on weight loss and has lost about 30 pounds over the last few months.  Congratulated patient on this.  Will take HCTZ off of his medication list.  Follow-up in 1 year.     Subjective:  HPI:  Patient here for follow up. Seen a few months ago for elevated BP. He did not tolerate the medications.        Objective:  Physical Exam: BP 127/75   Pulse 71   Temp 97.6 F (36.4 C)   Ht 6\' 3"  (1.905 m)   Wt 199 lb 6.1 oz (90.4 kg)   SpO2 99%   BMI 24.92 kg/m   Wt Readings from Last 3 Encounters:  08/24/19 199 lb 6.1 oz (90.4 kg)  03/27/19 232 lb 6.4 oz (105.4 kg)  03/16/19 231 lb 3.2 oz (104.9 kg)  Gen: No acute distress, resting comfortably CV: Regular rate and rhythm with no murmurs appreciated Pulm: Normal work of breathing, clear to auscultation bilaterally with no crackles, wheezes, or rhonchi Neuro: Grossly normal, moves all extremities Psych: Normal affect and thought content      Tangela Dolliver M. 05/14/19, MD 08/24/2019 10:14 AM

## 2019-08-24 NOTE — Assessment & Plan Note (Signed)
At goal today.  He has been working very hard on weight loss and has lost about 30 pounds over the last few months.  Congratulated patient on this.  Will take HCTZ off of his medication list.  Follow-up in 1 year.

## 2019-08-24 NOTE — Assessment & Plan Note (Signed)
Congratulated patient on weight loss.  Recheck lipids next year.  Continue lifestyle modifications.

## 2019-08-29 DIAGNOSIS — Z1211 Encounter for screening for malignant neoplasm of colon: Secondary | ICD-10-CM | POA: Diagnosis not present

## 2019-09-04 LAB — COLOGUARD
Cologuard: NEGATIVE
Cologuard: NEGATIVE

## 2019-09-04 LAB — EXTERNAL GENERIC LAB PROCEDURE: COLOGUARD: NEGATIVE

## 2019-09-17 ENCOUNTER — Telehealth: Payer: Self-pay | Admitting: Family Medicine

## 2019-09-17 NOTE — Telephone Encounter (Signed)
Patient informed Cologuard test was negative.

## 2019-09-17 NOTE — Telephone Encounter (Signed)
Patient states that dr.parker ordered some tests, and has not heard anything about the Cologuard results. Asked for a returned phone call.

## 2019-09-19 ENCOUNTER — Telehealth: Payer: Self-pay | Admitting: *Deleted

## 2019-09-19 ENCOUNTER — Encounter: Payer: Self-pay | Admitting: Family Medicine

## 2019-09-19 NOTE — Telephone Encounter (Signed)
Date of birth verified by patient  Lab results given,Pt verbalized understanding   

## 2019-09-19 NOTE — Telephone Encounter (Signed)
Left message to return call to our office at their convenience.   Cologuard results negative

## 2019-10-18 DIAGNOSIS — S43432D Superior glenoid labrum lesion of left shoulder, subsequent encounter: Secondary | ICD-10-CM | POA: Diagnosis not present

## 2019-12-04 DIAGNOSIS — M79662 Pain in left lower leg: Secondary | ICD-10-CM | POA: Diagnosis not present

## 2019-12-20 DIAGNOSIS — S43432A Superior glenoid labrum lesion of left shoulder, initial encounter: Secondary | ICD-10-CM | POA: Diagnosis not present

## 2019-12-20 DIAGNOSIS — M9904 Segmental and somatic dysfunction of sacral region: Secondary | ICD-10-CM | POA: Diagnosis not present

## 2019-12-20 DIAGNOSIS — M9903 Segmental and somatic dysfunction of lumbar region: Secondary | ICD-10-CM | POA: Diagnosis not present

## 2019-12-20 DIAGNOSIS — M9905 Segmental and somatic dysfunction of pelvic region: Secondary | ICD-10-CM | POA: Diagnosis not present

## 2019-12-25 DIAGNOSIS — S43432A Superior glenoid labrum lesion of left shoulder, initial encounter: Secondary | ICD-10-CM | POA: Diagnosis not present

## 2019-12-25 DIAGNOSIS — M9903 Segmental and somatic dysfunction of lumbar region: Secondary | ICD-10-CM | POA: Diagnosis not present

## 2019-12-25 DIAGNOSIS — M9904 Segmental and somatic dysfunction of sacral region: Secondary | ICD-10-CM | POA: Diagnosis not present

## 2019-12-25 DIAGNOSIS — M9905 Segmental and somatic dysfunction of pelvic region: Secondary | ICD-10-CM | POA: Diagnosis not present

## 2019-12-31 DIAGNOSIS — S43432A Superior glenoid labrum lesion of left shoulder, initial encounter: Secondary | ICD-10-CM | POA: Diagnosis not present

## 2019-12-31 DIAGNOSIS — R319 Hematuria, unspecified: Secondary | ICD-10-CM | POA: Diagnosis not present

## 2019-12-31 DIAGNOSIS — M9903 Segmental and somatic dysfunction of lumbar region: Secondary | ICD-10-CM | POA: Diagnosis not present

## 2019-12-31 DIAGNOSIS — N3000 Acute cystitis without hematuria: Secondary | ICD-10-CM | POA: Diagnosis not present

## 2019-12-31 DIAGNOSIS — M9904 Segmental and somatic dysfunction of sacral region: Secondary | ICD-10-CM | POA: Diagnosis not present

## 2019-12-31 DIAGNOSIS — M9905 Segmental and somatic dysfunction of pelvic region: Secondary | ICD-10-CM | POA: Diagnosis not present

## 2020-01-04 DIAGNOSIS — M9905 Segmental and somatic dysfunction of pelvic region: Secondary | ICD-10-CM | POA: Diagnosis not present

## 2020-01-04 DIAGNOSIS — M9903 Segmental and somatic dysfunction of lumbar region: Secondary | ICD-10-CM | POA: Diagnosis not present

## 2020-01-04 DIAGNOSIS — M9904 Segmental and somatic dysfunction of sacral region: Secondary | ICD-10-CM | POA: Diagnosis not present

## 2020-01-04 DIAGNOSIS — S43432A Superior glenoid labrum lesion of left shoulder, initial encounter: Secondary | ICD-10-CM | POA: Diagnosis not present

## 2020-01-10 ENCOUNTER — Encounter: Payer: Self-pay | Admitting: Family Medicine

## 2020-01-10 ENCOUNTER — Other Ambulatory Visit: Payer: Self-pay

## 2020-01-10 ENCOUNTER — Ambulatory Visit (INDEPENDENT_AMBULATORY_CARE_PROVIDER_SITE_OTHER): Payer: BC Managed Care – PPO | Admitting: Family Medicine

## 2020-01-10 VITALS — BP 136/74 | HR 61 | Temp 98.7°F | Ht 75.0 in | Wt 195.4 lb

## 2020-01-10 DIAGNOSIS — R319 Hematuria, unspecified: Secondary | ICD-10-CM

## 2020-01-10 DIAGNOSIS — K644 Residual hemorrhoidal skin tags: Secondary | ICD-10-CM

## 2020-01-10 NOTE — Patient Instructions (Signed)
It was very nice to see you today!  You probably had a kidney stone in the past.  We will check a urine sample today.  We may need to get a CT scan depending on the results.  You also have hemorrhoids.  Please make sure that you are getting plenty of fiber in your diet.  You can also take a stool softener.  Let me know if not improving.  Take care, Dr Jimmey Ralph  Please try these tips to maintain a healthy lifestyle:   Eat at least 3 REAL meals and 1-2 snacks per day.  Aim for no more than 5 hours between eating.  If you eat breakfast, please do so within one hour of getting up.    Each meal should contain half fruits/vegetables, one quarter protein, and one quarter carbs (no bigger than a computer mouse)   Cut down on sweet beverages. This includes juice, soda, and sweet tea.     Drink at least 1 glass of water with each meal and aim for at least 8 glasses per day   Exercise at least 150 minutes every week.

## 2020-01-10 NOTE — Progress Notes (Signed)
   Brent Reid is a 54 y.o. male who presents today for an office visit.  Assessment/Plan:  New/Acute Problems: Hematuria No red flags. Symptoms have resolved.  May have had kidney stone that has passed.  Will check UA with microscopy today.  If positive will need CT scan.  Hemorrhoid  Visualized on patient's phone today.  No red flags.  Recommended good oral hydration, fiber, stool softener as needed.  He does not wish for surgical intervention at this point.    Subjective:  HPI:  Patient here with concern for hematuria. Started about a week ago for a single episode. Went to urgent care.  Work-up there was negative.  He has having some associated right lower back pain a couple weeks ago.  That is since subsided.  He has been working on a keto diet and is not sure if it is contributing.  Still significantly bloody bowel movement.  Thinks he may have a hemorrhoid.  No pain.  Pelvic pain intermittently when wiping.        Objective:  Physical Exam: Temp 98.7 F (37.1 C)   Ht 6\' 3"  (1.905 m)   Wt 195 lb 6.4 oz (88.6 kg)   BMI 24.42 kg/m   Gen: No acute distress, resting comfortably Neuro: Grossly normal, moves all extremities Psych: Normal affect and thought content      Syna Gad M. , MD 01/10/2020 1:32 PM

## 2020-01-11 LAB — URINALYSIS, ROUTINE W REFLEX MICROSCOPIC
Bilirubin Urine: NEGATIVE
Glucose, UA: NEGATIVE
Hgb urine dipstick: NEGATIVE
Leukocytes,Ua: NEGATIVE
Nitrite: NEGATIVE
Protein, ur: NEGATIVE
Specific Gravity, Urine: 1.021 (ref 1.001–1.03)
pH: 5.5 (ref 5.0–8.0)

## 2020-01-11 NOTE — Progress Notes (Signed)
Please inform patient of the following:  No blood in his urine. He very likely had a kidney stone that passed. Wou.ld like for him to let us know if paoin or bleeding returns.  Katina Degree. Jimmey Ralph, MD 01/11/2020 8:04 AM

## 2020-01-15 ENCOUNTER — Ambulatory Visit: Payer: BC Managed Care – PPO | Admitting: Family Medicine

## 2020-04-15 DIAGNOSIS — M545 Low back pain, unspecified: Secondary | ICD-10-CM | POA: Diagnosis not present

## 2020-04-15 DIAGNOSIS — Z6824 Body mass index (BMI) 24.0-24.9, adult: Secondary | ICD-10-CM | POA: Diagnosis not present

## 2020-07-04 ENCOUNTER — Ambulatory Visit (INDEPENDENT_AMBULATORY_CARE_PROVIDER_SITE_OTHER): Payer: BC Managed Care – PPO | Admitting: Registered Nurse

## 2020-07-04 ENCOUNTER — Encounter: Payer: Self-pay | Admitting: Registered Nurse

## 2020-07-04 ENCOUNTER — Other Ambulatory Visit: Payer: Self-pay

## 2020-07-04 VITALS — BP 129/77 | HR 78 | Temp 98.0°F | Resp 18 | Ht 75.0 in

## 2020-07-04 DIAGNOSIS — M19079 Primary osteoarthritis, unspecified ankle and foot: Secondary | ICD-10-CM | POA: Diagnosis not present

## 2020-07-04 NOTE — Progress Notes (Signed)
Acute Office Visit  Subjective:    Patient ID: Brent Reid, male    DOB: Apr 13, 1965, 55 y.o.   MRN: [id number]  Chief Complaint  Patient presents with  . Foot Pain    Patient states he has been having some right foot pain for over a month. Patient thinks the pain is starting to get a little more severe and think it is possibly gout.    HPI Patient is in today for toe pain  R great toe, lateral MTP joint pain, redness, and swelling Hx of turf toe, this feels different No trauma  Notes pain onset around 1 mo ago  Trip to [place], admits dehydration and increase etoh consumption Also has been on keto style diet over past 2 years, has lost 40lb  No hx of gout  Past Medical History:  Diagnosis Date  . Hyperlipidemia   . Hypertension     Past Surgical History:  Procedure Laterality Date  . BACK SURGERY    . KNEE SURGERY    . TONSILLECTOMY      Family History  Problem Relation Age of Onset  . Stroke Mother   . Heart failure Father   . CAD Father     Social History   Socioeconomic History  . Marital status: Married    Spouse name: Not on file  . Number of children: 1  . Years of education: Not on file  . Highest education level: Not on file  Occupational History    Comment: Real Estate  Tobacco Use  . Smoking status: Never Smoker  . Smokeless tobacco: Never Used  Vaping Use  . Vaping Use: Never used  Substance and Sexual Activity  . Alcohol use: Yes    Comment: Occasional  . Drug use: Never  . Sexual activity: Not on file  Other Topics Concern  . Not on file  Social History Narrative  . Not on file   Social Determinants of Health   Financial Resource Strain: Not on file  Food Insecurity: Not on file  Transportation Needs: Not on file  Physical Activity: Not on file  Stress: Not on file  Social Connections: Not on file  Intimate Partner Violence: Not on file    No outpatient medications prior to visit.   No facility-administered  medications prior to visit.    No Known Allergies  Review of Systems  Constitutional: Negative.   HENT: Negative.   Eyes: Negative.   Respiratory: Negative.   Cardiovascular: Negative.   Gastrointestinal: Negative.   Endocrine: Negative.   Genitourinary: Negative.   Musculoskeletal: Positive for arthralgias.  Skin: Negative.   Allergic/Immunologic: Negative.   Neurological: Negative.   Hematological: Negative.   Psychiatric/Behavioral: Negative.   All other systems reviewed and are negative.      Objective:    Physical Exam Vitals and nursing note reviewed.  Constitutional:      Appearance: Normal appearance.  Cardiovascular:     Rate and Rhythm: Normal rate and regular rhythm.     Pulses: Normal pulses.     Heart sounds: Normal heart sounds. No murmur heard. No friction rub. No gallop.   Pulmonary:     Effort: Pulmonary effort is normal. No respiratory distress.     Breath sounds: Normal breath sounds. No stridor. No wheezing, rhonchi or rales.  Musculoskeletal:        General: Swelling and tenderness present.  Neurological:     General: No focal deficit present.     Mental  Status: He is alert. Mental status is at baseline.  Psychiatric:        Mood and Affect: Mood normal.        Behavior: Behavior normal.        Thought Content: Thought content normal.        Judgment: Judgment normal.     BP 129/77   Pulse 78   Temp 98 F (36.7 C) (Temporal)   Resp 18   Ht 6\' 3"  (1.905 m)   SpO2 99%   BMI 24.42 kg/m  Wt Readings from Last 3 Encounters:  01/10/20 195 lb 6.4 oz (88.6 kg)  08/24/19 199 lb 6.1 oz (90.4 kg)  03/27/19 232 lb 6.4 oz (105.4 kg)    There are no preventive care reminders to display for this patient.  There are no preventive care reminders to display for this patient.   Lab Results  Component Value Date   TSH 1.477 03/12/2019   Lab Results  Component Value Date   WBC 4.2 03/12/2019   HGB 15.0 03/12/2019   HCT 43.8 03/12/2019    MCV 85.9 03/12/2019   PLT 236 03/12/2019   Lab Results  Component Value Date   NA 138 04/27/2019   K 3.8 04/27/2019   CO2 25 04/27/2019   GLUCOSE 105 (H) 04/27/2019   BUN 17 04/27/2019   CREATININE 0.86 04/27/2019   BILITOT 0.4 03/10/2019   ALKPHOS 59 03/10/2019   AST 17 03/10/2019   ALT 20 03/10/2019   PROT 7.5 03/10/2019   ALBUMIN 4.7 03/10/2019   CALCIUM 9.9 04/27/2019   ANIONGAP 8 03/12/2019   No results found for: CHOL No results found for: HDL No results found for: LDLCALC No results found for: TRIG No results found for: CHOLHDL No results found for: 05/10/2019     Assessment & Plan:   Problem List Items Addressed This Visit   None      No orders of the defined types were placed in this encounter.  PLAN  Appears as classic gout both in symptoms and history  Ok to continue diclofenac 75mg  PO bid for pain  Labs today to check renal function and uric acid  If indicated will start allopurinol  Given advice on low purine diet  Patient encouraged to call clinic with any questions, comments, or concerns.  [id number], NP

## 2020-07-04 NOTE — Patient Instructions (Signed)
Textbook of Natural Medicine (5th ed., pp. 1518-1527.e3). [place], [place]: Elsevier.">  Dietary Guidelines to Help Prevent Kidney Stones Kidney stones are deposits of minerals and salts that form inside your kidneys. Your risk of developing kidney stones may be greater depending on your diet, your lifestyle, the medicines you take, and whether you have certain medical conditions. Most people can lower their chances of developing kidney stones by following the instructions below. Your dietitian may give you more specific instructions depending on your overall health and the type of kidney stones you tend to develop. What are tips for following this plan? Reading food labels  Choose foods with "no salt added" or "low-salt" labels. Limit your salt (sodium) intake to less than 1,500 mg a day.  Choose foods with calcium for each meal and snack. Try to eat about 300 mg of calcium at each meal. Foods that contain 200-500 mg of calcium a serving include: ? 8 oz (237 mL) of milk, calcium-fortifiednon-dairy milk, and calcium-fortifiedfruit juice. Calcium-fortified means that calcium has been added to these drinks. ? 8 oz (237 mL) of kefir, yogurt, and soy yogurt. ? 4 oz (114 g) of tofu. ? 1 oz (28 g) of cheese. ? 1 cup (150 g) of dried figs. ? 1 cup (91 g) of cooked broccoli. ? One 3 oz (85 g) can of sardines or mackerel. Most people need 1,000-1,500 mg of calcium a day. Talk to your dietitian about how much calcium is recommended for you.   Shopping  Buy plenty of fresh fruits and vegetables. Most people do not need to avoid fruits and vegetables, even if these foods contain nutrients that may contribute to kidney stones.  When shopping for convenience foods, choose: ? Whole pieces of fruit. ? Pre-made salads with dressing on the side. ? Low-fat fruit and yogurt smoothies.  Avoid buying frozen meals or prepared deli foods. These can be high in sodium.  Look for foods with live cultures, such as  yogurt and kefir.  Choose high-fiber grains, such as whole-wheat breads, oat bran, and wheat cereals. Cooking  Do not add salt to food when cooking. Place a salt shaker on the table and allow each person to add his or her own salt to taste.  Use vegetable protein, such as beans, textured vegetable protein (TVP), or tofu, instead of meat in pasta, casseroles, and soups. Meal planning  Eat less salt, if told by your dietitian. To do this: ? Avoid eating processed or pre-made food. ? Avoid eating fast food.  Eat less animal protein, including cheese, meat, poultry, or fish, if told by your dietitian. To do this: ? Limit the number of times you have meat, poultry, fish, or cheese each week. Eat a diet free of meat at least 2 days a week. ? Eat only one serving each day of meat, poultry, fish, or seafood. ? When you prepare animal protein, cut pieces into small portion sizes. For most meat and fish, one serving is about the size of the palm of your hand.  Eat at least five servings of fresh fruits and vegetables each day. To do this: ? Keep fruits and vegetables on hand for snacks. ? Eat one piece of fruit or a handful of berries with breakfast. ? Have a salad and fruit at lunch. ? Have two kinds of vegetables at dinner.  Limit foods that are high in a substance called oxalate. These include: ? Spinach (cooked), rhubarb, beets, sweet potatoes, and Swiss chard. ? Peanuts. ?   Potato chips, french fries, and baked potatoes with skin on. ? Nuts and nut products. ? Chocolate.  If you regularly take a diuretic medicine, make sure to eat at least 1 or 2 servings of fruits or vegetables that are high in potassium each day. These include: ? Avocado. ? Banana. ? Orange, prune, carrot, or tomato juice. ? Baked potato. ? Cabbage. ? Beans and split peas. Lifestyle  Drink enough fluid to keep your urine pale yellow. This is the most important thing you can do. Spread your fluid intake throughout  the day.  If you drink alcohol: ? Limit how much you use to:  0-1 drink a day for women who are not pregnant.  0-2 drinks a day for men. ? Be aware of how much alcohol is in your drink. In the [place] one drink equals one 12 oz bottle of beer (355 mL), one 5 oz glass of wine (148 mL), or one 1 oz glass of hard liquor (44 mL).  Lose weight if told by your health care provider. Work with your dietitian to find an eating plan and weight loss strategies that work best for you.   General information  Talk to your health care provider and dietitian about taking daily supplements. You may be told the following depending on your health and the cause of your kidney stones: ? Not to take supplements with vitamin C. ? To take a calcium supplement. ? To take a daily probiotic supplement. ? To take other supplements such as magnesium, fish oil, or vitamin B6.  Take over-the-counter and prescription medicines only as told by your health care provider. These include supplements. What foods should I limit? Limit your intake of the following foods, or eat them as told by your dietitian. Vegetables Spinach. Rhubarb. Beets. Canned vegetables. Rosita Fire. Olives. Baked potatoes with skin. Grains Wheat bran. Baked goods. Salted crackers. Cereals high in sugar. Meats and other proteins Nuts. Nut butters. Large portions of meat, poultry, or fish. Salted, precooked, or cured meats, such as sausages, meat loaves, and hot dogs. Dairy Cheese. Beverages Regular soft drinks. Regular vegetable juice. Seasonings and condiments Seasoning blends with salt. Salad dressings. Soy sauce. Ketchup. Barbecue sauce. Other foods Canned soups. Canned pasta sauce. Casseroles. Pizza. Lasagna. Frozen meals. Potato chips. [place] fries. The items listed above may not be a complete list of foods and beverages you should limit. Contact a dietitian for more information. What foods should I avoid? Talk to your dietitian about  specific foods you should avoid based on the type of kidney stones you have and your overall health. Fruits Grapefruit. The item listed above may not be a complete list of foods and beverages you should avoid. Contact a dietitian for more information. Summary  Kidney stones are deposits of minerals and salts that form inside your kidneys.  You can lower your risk of kidney stones by making changes to your diet.  The most important thing you can do is drink enough fluid. Drink enough fluid to keep your urine pale yellow.  Talk to your dietitian about how much calcium you should have each day, and eat less salt and animal protein as told by your dietitian. This information is not intended to replace advice given to you by your health care provider. Make sure you discuss any questions you have with your health care provider. Document Revised: 01/11/2019 Document Reviewed: 01/11/2019 Elsevier Patient Education  2021 [employer] Inc.  Low-Purine Eating Plan A low-purine eating plan involves making food choices  to limit your intake of purine. Purine is a kind of uric acid. Too much uric acid in your blood can cause certain conditions, such as gout and kidney stones. Eating a low-purine diet can help control these conditions. What are tips for following this plan? Reading food labels  Avoid foods with saturated or Trans fat.  Check the ingredient list of grains-based foods, such as bread and cereal, to make sure that they contain whole grains.  Check the ingredient list of sauces or soups to make sure they do not contain meat or fish.  When choosing soft drinks, check the ingredient list to make sure they do not contain high-fructose corn syrup. Shopping  Buy plenty of fresh fruits and vegetables.  Avoid buying canned or fresh fish.  Buy dairy products labeled as low-fat or nonfat.  Avoid buying premade or processed foods. These foods are often high in fat, salt (sodium), and added sugar.    Cooking  Use olive oil instead of butter when cooking. Oils like olive oil, canola oil, and sunflower oil contain healthy fats. Meal planning  Learn which foods do or do not affect you. If you find out that a food tends to cause your gout symptoms to flare up, avoid eating that food. You can enjoy foods that do not cause problems. If you have any questions about a food item, talk with your dietitian or health care provider.  Limit foods high in fat, especially saturated fat. Fat makes it harder for your body to get rid of uric acid.  Choose foods that are lower in fat and are lean sources of protein. General guidelines  Limit alcohol intake to no more than 1 drink a day for nonpregnant women and 2 drinks a day for men. One drink equals 12 oz of beer, 5 oz of wine, or 1 oz of hard liquor. Alcohol can affect the way your body gets rid of uric acid.  Drink plenty of water to keep your urine clear or pale yellow. Fluids can help remove uric acid from your body.  If directed by your health care provider, take a vitamin C supplement.  Work with your health care provider and dietitian to develop a plan to achieve or maintain a healthy weight. Losing weight can help reduce uric acid in your blood. What foods are recommended? The items listed may not be a complete list. Talk with your dietitian about what dietary choices are best for you. Foods low in purines Foods low in purines do not need to be limited. These include:  All fruits.  All low-purine vegetables, pickles, and olives.  Breads, pasta, rice, cornbread, and popcorn. Cake and other baked goods.  All dairy foods.  Eggs, nuts, and nut butters.  Spices and condiments, such as salt, herbs, and vinegar.  Plant oils, butter, and margarine.  Water, sugar-free soft drinks, tea, coffee, and cocoa.  Vegetable-based soups, broths, sauces, and gravies. Foods moderate in purines Foods moderate in purines should be limited to the  amounts listed.   cup of asparagus, cauliflower, spinach, mushrooms, or green peas, each day.  2/3 cup uncooked oatmeal, each day.   cup dry wheat bran or wheat germ, each day.  2-3 ounces of meat or poultry, each day.  4-6 ounces of shellfish, such as crab, lobster, oysters, or shrimp, each day.  1 cup cooked beans, peas, or lentils, each day.  Soup, broths, or bouillon made from meat or fish. Limit these foods as much as possible. What  foods are not recommended? The items listed may not be a complete list. Talk with your dietitian about what dietary choices are best for you. Limit your intake of foods high in purines, including:  Beer and other alcohol.  Meat-based gravy or sauce.  Canned or fresh fish, such as: ? Anchovies, sardines, herring, and tuna. ? Mussels and scallops. ? Codfish, trout, and haddock.  Tomasa Blase.  Organ meats, such as: ? Liver or kidney. ? Tripe. ? Sweetbreads (thymus gland or pancreas).  Wild Education officer, environmental.  Yeast or yeast extract supplements.  Drinks sweetened with high-fructose corn syrup. Summary  Eating a low-purine diet can help control conditions caused by too much uric acid in the body, such as gout or kidney stones.  Choose low-purine foods, limit alcohol, and limit foods high in fat.  You will learn over time which foods do or do not affect you. If you find out that a food tends to cause your gout symptoms to flare up, avoid eating that food. This information is not intended to replace advice given to you by your health care provider. Make sure you discuss any questions you have with your health care provider. Document Revised: 05/03/2019 Document Reviewed: 05/03/2019 Elsevier Patient Education  2021 Elsevier Inc.  Gout  Gout is a condition that causes painful swelling of the joints. Gout is a type of inflammation of the joints (arthritis). This condition is caused by having too much uric acid in the body. Uric acid is a chemical  that forms when the body breaks down substances called purines. Purines are important for building body proteins. When the body has too much uric acid, sharp crystals can form and build up inside the joints. This causes pain and swelling. Gout attacks can happen quickly and may be very painful (acute gout). Over time, the attacks can affect more joints and become more frequent (chronic gout). Gout can also cause uric acid to build up under the skin and inside the kidneys. What are the causes? This condition is caused by too much uric acid in your blood. This can happen because:  Your kidneys do not remove enough uric acid from your blood. This is the most common cause.  Your body makes too much uric acid. This can happen with some cancers and cancer treatments. It can also occur if your body is breaking down too many red blood cells (hemolytic anemia).  You eat too many foods that are high in purines. These foods include organ meats and some seafood. Alcohol, especially beer, is also high in purines. A gout attack may be triggered by trauma or stress. What increases the risk? You are more likely to develop this condition if you:  Have a family history of gout.  Are male and middle-aged.  Are male and have gone through menopause.  Are obese.  Frequently drink alcohol, especially beer.  Are dehydrated.  Lose weight too quickly.  Have an organ transplant.  Have lead poisoning.  Take certain medicines, including aspirin, cyclosporine, diuretics, levodopa, and niacin.  Have kidney disease.  Have a skin condition called psoriasis. What are the signs or symptoms? An attack of acute gout happens quickly. It usually occurs in just one joint. The most common place is the big toe. Attacks often start at night. Other joints that may be affected include joints of the feet, ankle, knee, fingers, wrist, or elbow. Symptoms of this condition may include:  Severe  pain.  Warmth.  Swelling.  Stiffness.  Tenderness.  The affected joint may be very painful to touch.  Shiny, red, or purple skin.  Chills and fever. Chronic gout may cause symptoms more frequently. More joints may be involved. You may also have white or yellow lumps (tophi) on your hands or feet or in other areas near your joints.   How is this diagnosed? This condition is diagnosed based on your symptoms, medical history, and physical exam. You may have tests, such as:  Blood tests to measure uric acid levels.  Removal of joint fluid with a thin needle (aspiration) to look for uric acid crystals.  X-rays to look for joint damage. How is this treated? Treatment for this condition has two phases: treating an acute attack and preventing future attacks. Acute gout treatment may include medicines to reduce pain and swelling, including:  NSAIDs.  Steroids. These are strong anti-inflammatory medicines that can be taken by mouth (orally) or injected into a joint.  Colchicine. This medicine relieves pain and swelling when it is taken soon after an attack. It can be given by mouth or through an IV. Preventive treatment may include:  Daily use of smaller doses of NSAIDs or colchicine.  Use of a medicine that reduces uric acid levels in your blood.  Changes to your diet. You may need to see a dietitian about what to eat and drink to prevent gout. Follow these instructions at home: During a gout attack  If directed, put ice on the affected area: ? Put ice in a plastic bag. ? Place a towel between your skin and the bag. ? Leave the ice on for 20 minutes, 2-3 times a day.  Raise (elevate) the affected joint above the level of your heart as often as possible.  Rest the joint as much as possible. If the affected joint is in your leg, you may be given crutches to use.  Follow instructions from your health care provider about eating or drinking restrictions.   Avoiding future gout  attacks  Follow a low-purine diet as told by your dietitian or health care provider. Avoid foods and drinks that are high in purines, including liver, kidney, anchovies, asparagus, herring, mushrooms, mussels, and beer.  Maintain a healthy weight or lose weight if you are overweight. If you want to lose weight, talk with your health care provider. It is important that you do not lose weight too quickly.  Start or maintain an exercise program as told by your health care provider. Eating and drinking  Drink enough fluids to keep your urine pale yellow.  If you drink alcohol: ? Limit how much you use to:  0-1 drink a day for women.  0-2 drinks a day for men. ? Be aware of how much alcohol is in your drink. In the [place] one drink equals one 12 oz bottle of beer (355 mL) one 5 oz glass of wine (148 mL), or one 1 oz glass of hard liquor (44 mL). General instructions  Take over-the-counter and prescription medicines only as told by your health care provider.  Do not drive or use heavy machinery while taking prescription pain medicine.  Return to your normal activities as told by your health care provider. Ask your health care provider what activities are safe for you.  Keep all follow-up visits as told by your health care provider. This is important. Contact a health care provider if you have:  Another gout attack.  Continuing symptoms of a gout attack after 10 days of treatment.  Side effects from  your medicines.  Chills or a fever.  Burning pain when you urinate.  Pain in your lower back or belly. Get help right away if you:  Have severe or uncontrolled pain.  Cannot urinate. Summary  Gout is painful swelling of the joints caused by inflammation.  The most common site of pain is the big toe, but it can affect other joints in the body.  Medicines and dietary changes can help to prevent and treat gout attacks. This information is not intended to replace advice given to  you by your health care provider. Make sure you discuss any questions you have with your health care provider. Document Revised: 08/10/2017 Document Reviewed: 08/10/2017 Elsevier Patient Education  2021 [employer].

## 2020-07-07 LAB — COMPREHENSIVE METABOLIC PANEL
ALT: 19 U/L (ref 0–53)
AST: 19 U/L (ref 0–37)
Albumin: 4.9 g/dL (ref 3.5–5.2)
Alkaline Phosphatase: 52 U/L (ref 39–117)
BUN: 18 mg/dL (ref 6–23)
CO2: 26 mEq/L (ref 19–32)
Calcium: 10.2 mg/dL (ref 8.4–10.5)
Chloride: 104 mEq/L (ref 96–112)
Creatinine, Ser: 0.89 mg/dL (ref 0.40–1.50)
GFR: 98.24 mL/min (ref >60.00)
Glucose, Bld: 76 mg/dL (ref 70–99)
Potassium: 4.8 mEq/L (ref 3.5–5.1)
Sodium: 141 mEq/L (ref 135–145)
Total Bilirubin: 0.7 mg/dL (ref 0.2–1.2)
Total Protein: 7.3 g/dL (ref 6.0–8.3)

## 2020-07-07 LAB — URIC ACID: Uric Acid, Serum: 5.7 mg/dL (ref 4.0–7.8)

## 2020-07-07 NOTE — Telephone Encounter (Signed)
Patient is calling in requesting lab results.  Patient can be reached back at [phone].

## 2020-07-18 ENCOUNTER — Encounter: Payer: Self-pay | Admitting: Family Medicine

## 2020-07-18 ENCOUNTER — Other Ambulatory Visit: Payer: Self-pay

## 2020-07-18 ENCOUNTER — Ambulatory Visit (INDEPENDENT_AMBULATORY_CARE_PROVIDER_SITE_OTHER): Payer: BC Managed Care – PPO | Admitting: Family Medicine

## 2020-07-18 VITALS — BP 121/80 | HR 62 | Temp 98.4°F | Ht 75.0 in | Wt 197.8 lb

## 2020-07-18 DIAGNOSIS — M25675 Stiffness of left foot, not elsewhere classified: Secondary | ICD-10-CM

## 2020-07-18 DIAGNOSIS — M25674 Stiffness of right foot, not elsewhere classified: Secondary | ICD-10-CM | POA: Diagnosis not present

## 2020-07-18 DIAGNOSIS — K644 Residual hemorrhoidal skin tags: Secondary | ICD-10-CM

## 2020-07-18 NOTE — Progress Notes (Signed)
Same day acute visit; PCP not available. New pt to me. Chart reviewed.   Subjective  CC:  Chief Complaint  Patient presents with   Gout    On going for about a month, mainly right foot   Rectal Bleeding    Blood in stool, realized about a week ago. Has eased up some    HPI: Brent Reid is a 55 y.o. male who presents to the office today to address the problems listed above in the chief complaint. 55 yo with bilateral great toe stiffness (90% on right and 10% on left) and discomfort x 1 month. Was evaluated in early June for same. I reviewed that note. Was told it could be gout and had blood work then. Reports blood work results were given to him and was told it wasn't gout. He took diclofenac on a few occasions (uses occasionally for pain) and can't be certain it helped or not. Reports worse stiffness in the am and feels better after walking and up a bit.  He also is concerned because he has inner elbow pain intermittently that started at the same time. He denies red, hot swollen or tender joints. He plays [employer]; it has not limited him.  H/o rectal bleeding due to hemorrhoid: has about 5 days of bright red blood with defacation, nonpainful. Improving. None today. Thinks related to hemorrhoid. No gi sxs, fatigue. He had a negative cologuard test in July 2021.  Assessment  1. Joint stiffness of toe of left foot   2. Joint stiffness of toe, right   3. External hemorrhoid      Plan  Toe stiffness/discomfort:  clinically not necessarily consistent with gout, although it is possible. We had a lengthy discussion. I recommended an xray to evaluate the joint, discussed the possibility of arthriits or tendonopathy but patient declined. Pt was frustrated and wanted to know why pain started a month ago along with elbow pain. I recommend sports medicine or ortho evaluation. Or f/u with PCP.  Rectal bleeding: improving. If persists, recommend evaluation again. There are no red flag sxs  present.  Today's visit was 32 minutes long. Greater than 50% of this time was devoted to face to face counseling with the patient and coordination of care. We discussed his diagnosis, prognosis, treatment options and treatment plan is documented above. Pt was not happy about the discussion. I offered multiple further evaluative options of care; he declined.    Follow up:   Visit date not found  No orders of the defined types were placed in this encounter.  No orders of the defined types were placed in this encounter.     I reviewed the patients updated PMH, FH, and SocHx.    Patient Active Problem List   Diagnosis Date Noted   Essential hypertension 03/16/2019   Stress 03/16/2019   Vitamin D deficiency 03/16/2019   Dyslipidemia 03/16/2019   No outpatient medications have been marked as taking for the 07/18/20 encounter (Office Visit) with Willow Ora, MD.    Allergies: Patient has No Known Allergies. Family History: Patient family history includes CAD in his father; Heart failure in his father; Stroke in his mother. Social History:  Patient  reports that he has never smoked. He has never used smokeless tobacco. He reports current alcohol use. He reports that he does not use drugs.  Review of Systems: Constitutional: Negative for fever malaise or anorexia Cardiovascular: negative for chest pain Respiratory: negative for SOB or persistent cough  Gastrointestinal: negative for abdominal pain  Objective  Vitals: BP 121/80   Pulse 62   Temp 98.4 F (36.9 C) (Temporal)   Ht 6\' 3"  (1.905 m)   Wt 197 lb 12.8 oz (89.7 kg)   SpO2 96%   BMI 24.72 kg/m  General: no acute distress , A&Ox3 Right foot: great toe, no erythema, warmth and minimal ttp at MTP. Nl gait. No extensor tendon tenderness  No visits with results within 1 Day(s) from this visit.  Latest known visit with results is:  Office Visit on 07/04/2020  Component Date Value Ref Range Status   Uric Acid, Serum  07/04/2020 5.7  4.0 - 7.8 mg/dL Final   Sodium 09/03/2020 141  135 - 145 mEq/L Final   Potassium 07/04/2020 4.8  3.5 - 5.1 mEq/L Final   Chloride 07/04/2020 104  96 - 112 mEq/L Final   CO2 07/04/2020 26  19 - 32 mEq/L Final   Glucose, Bld 07/04/2020 76  70 - 99 mg/dL Final   BUN 09/03/2020 18  6 - 23 mg/dL Final   Creatinine, Ser 07/04/2020 0.89  0.40 - 1.50 mg/dL Final   Total Bilirubin 07/04/2020 0.7  0.2 - 1.2 mg/dL Final   Alkaline Phosphatase 07/04/2020 52  39 - 117 U/L Final   AST 07/04/2020 19  0 - 37 U/L Final   ALT 07/04/2020 19  0 - 53 U/L Final   Total Protein 07/04/2020 7.3  6.0 - 8.3 g/dL Final   Albumin 09/03/2020 4.9  3.5 - 5.2 g/dL Final   GFR 75/11/2583 98.24  >60.00 mL/min Final   Calcium 07/04/2020 10.2  8.4 - 10.5 mg/dL Final     Commons side effects, risks, benefits, and alternatives for medications and treatment plan prescribed today were discussed, and the patient expressed understanding of the given instructions. Patient is instructed to call or message via MyChart if he/she has any questions or concerns regarding our treatment plan. No barriers to understanding were identified. We discussed Red Flag symptoms and signs in detail. Patient expressed understanding regarding what to do in case of urgent or emergency type symptoms.  Medication list was reconciled, printed and provided to the patient in AVS. Patient instructions and summary information was reviewed with the patient as documented in the AVS. This note was prepared with assistance of Dragon voice recognition software. Occasional wrong-word or sound-a-like substitutions may have occurred due to the inherent limitations of voice recognition software  This visit occurred during the SARS-CoV-2 public health emergency.  Safety protocols were in place, including screening questions prior to the visit, additional usage of staff PPE, and extensive cleaning of exam room while observing appropriate contact time as  indicated for disinfecting solutions.

## 2020-07-22 ENCOUNTER — Encounter: Payer: Self-pay | Admitting: Family Medicine

## 2020-07-22 NOTE — Telephone Encounter (Signed)
Spoke with patient. Patient stated had two visit with other provider and still with same issues  Still with discomfort on feet and joint  and blood in stool  Has same Sx on the past due to diverticulitis  Requesting antibiotic if is possible  Was offer an appointment and patient decline due to multiples visit with out with different doctors  .  Please advise

## 2020-07-23 ENCOUNTER — Other Ambulatory Visit: Payer: Self-pay

## 2020-07-23 MED ORDER — AMOXICILLIN-POT CLAVULANATE 875-125 MG PO TABS: 1.0000 | ORAL_TABLET | Freq: Two times a day (BID) | ORAL | 0 refills | Status: DC

## 2020-07-23 NOTE — Telephone Encounter (Signed)
Sent Antibiotic to pharmacy. Pt is aware. He states that this is the first day that he has not bled in 9 days. He denies fever, and nausea. He will follow up in a weeks time after taking, to update on how he is doing and if he wants to proceed with Colonoscopy.

## 2020-09-15 DIAGNOSIS — S43432D Superior glenoid labrum lesion of left shoulder, subsequent encounter: Secondary | ICD-10-CM | POA: Diagnosis not present

## 2020-09-19 DIAGNOSIS — S43432D Superior glenoid labrum lesion of left shoulder, subsequent encounter: Secondary | ICD-10-CM | POA: Diagnosis not present

## 2020-11-10 IMAGING — CT CT HEAD W/O CM
3 series · 16 of 47 positions shown, 19 images · non-contrast
Comparison: None.

CLINICAL DATA: 53-year-old male with ataxia.  Concern for stroke.

EXAM:
CT HEAD WITHOUT CONTRAST
TECHNIQUE: Contiguous axial images were obtained from the base of the skull
through the vertex without intravenous contrast.

[Series 2: head wo · axial · 0.45mm/px · z∈[-158,-23]mm · 10 of 33 slices shown, 13 images]
[im 3/33  brain]
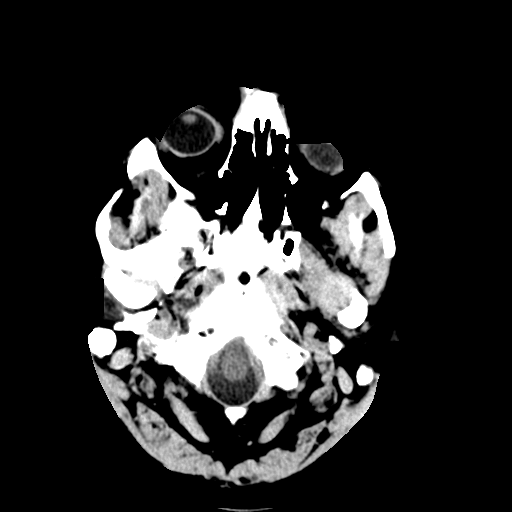
[im 3/33  bone]
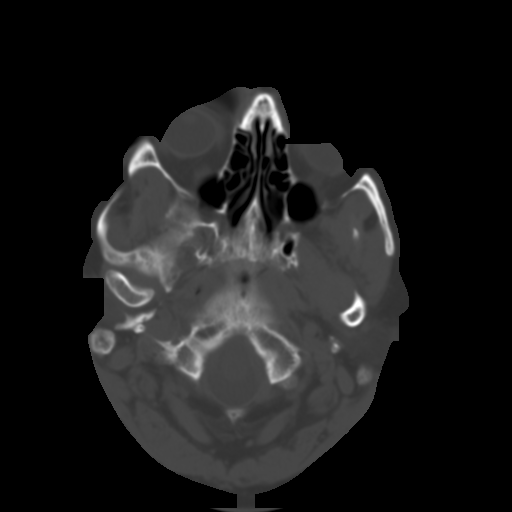
[im 6/33  brain]
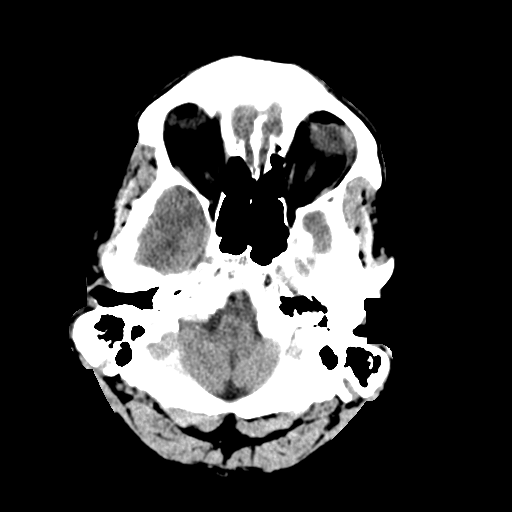
[im 9/33  brain]
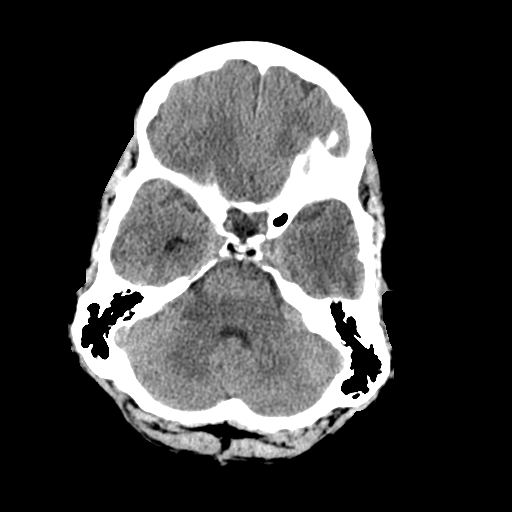
[im 12/33  brain]
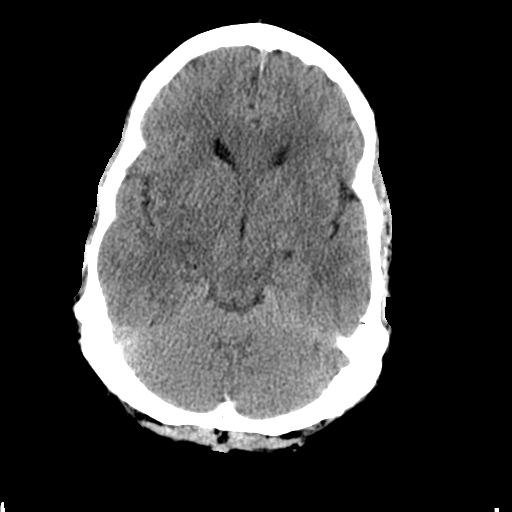
[im 15/33  brain]
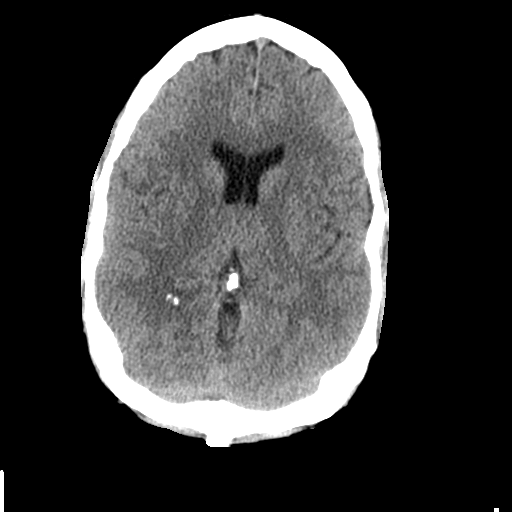
[im 15/33  bone]
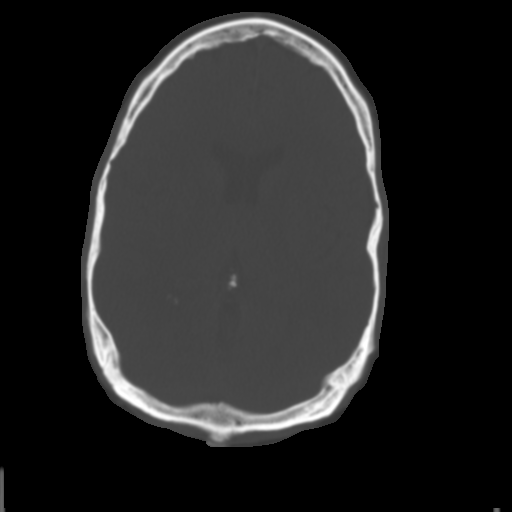
[im 18/33  brain]
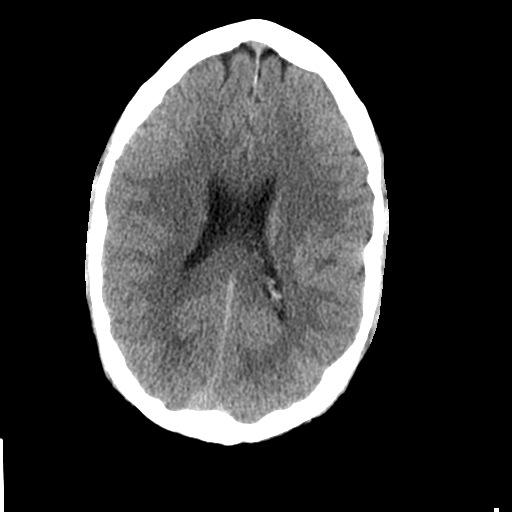
[im 21/33  brain]
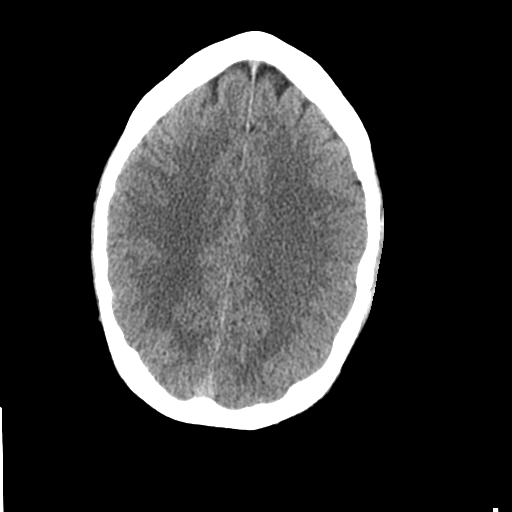
[im 25/33  brain]
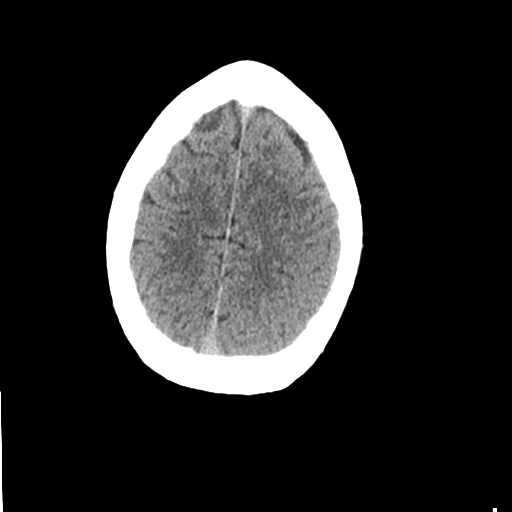
[im 27/33  brain]
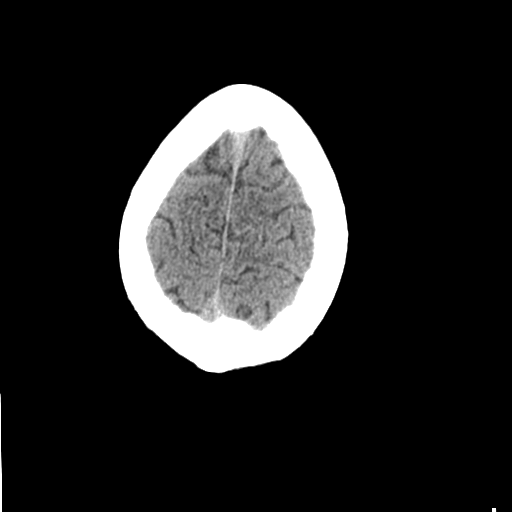
[im 27/33  bone]
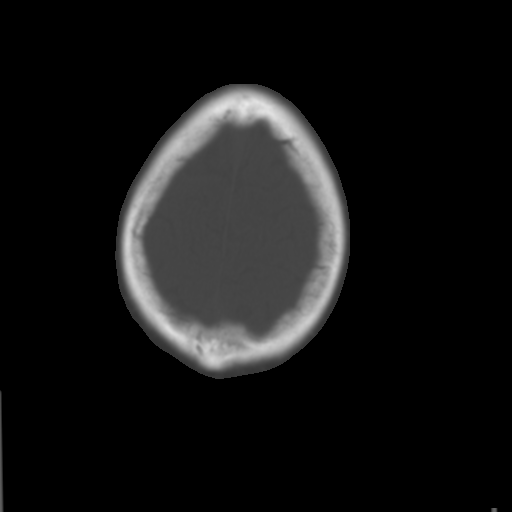
[im 30/33  brain]
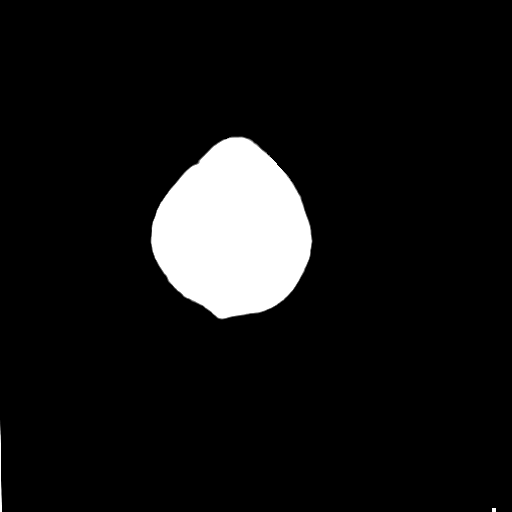

[Series 5: coronal soft tissue · coronal · 0.33mm/px · 3 of 74 slices shown]
[im 25/74  brain]
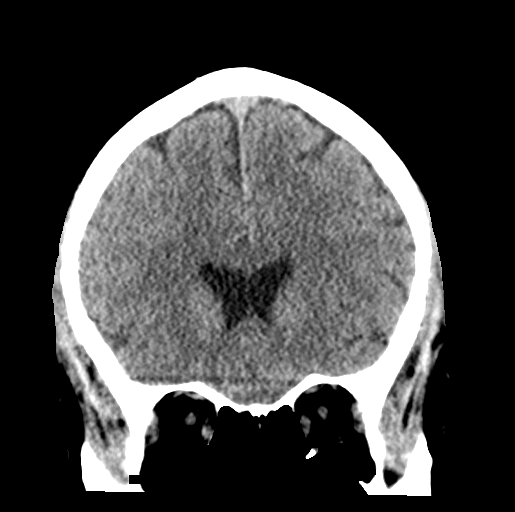
[im 33/74  brain]
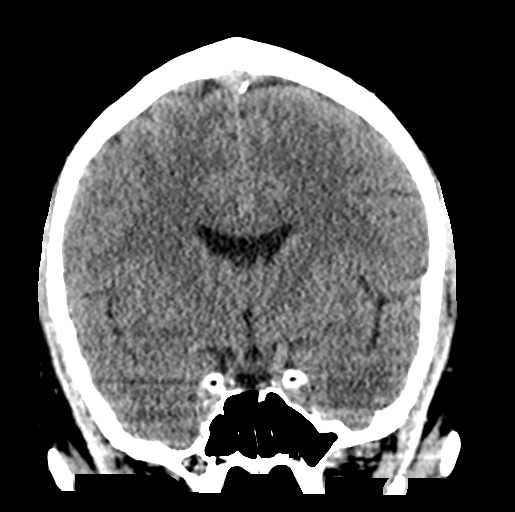
[im 41/74  brain]
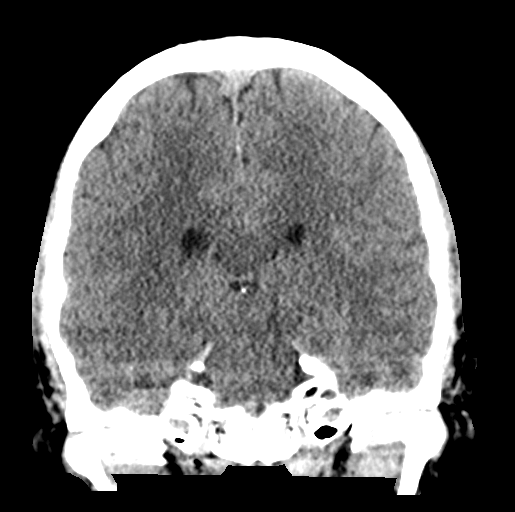

[Series 6: sagittal soft tissue · sagittal · 0.32mm/px · 3 of 58 slices shown]
[im 20/58  brain]
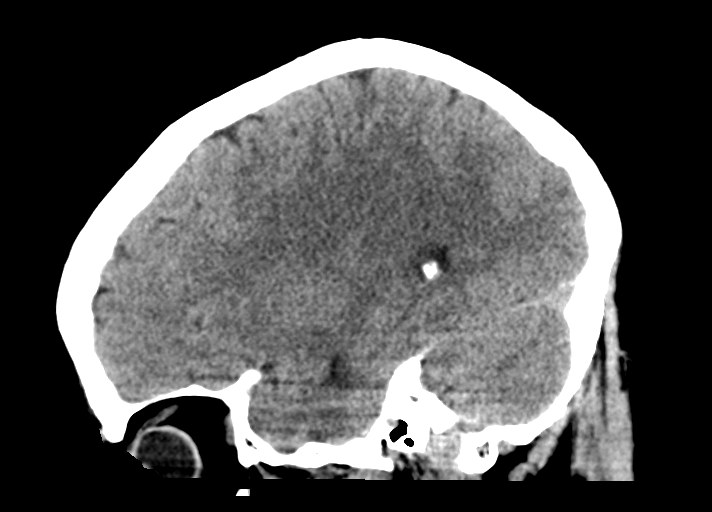
[im 29/58  brain]
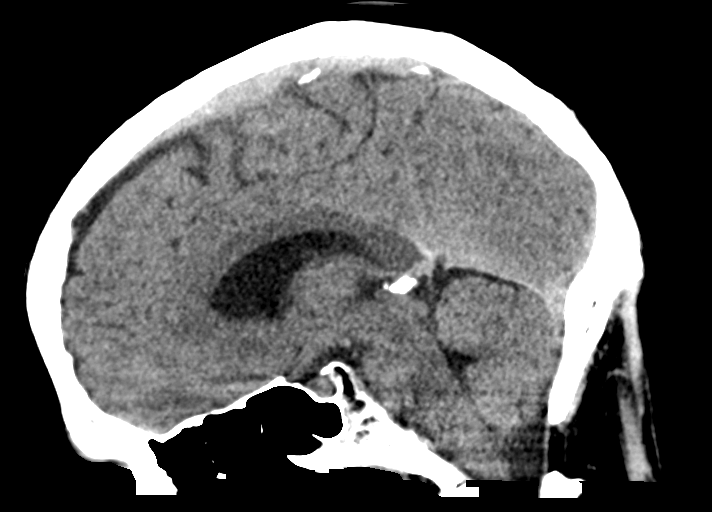
[im 39/58  brain]
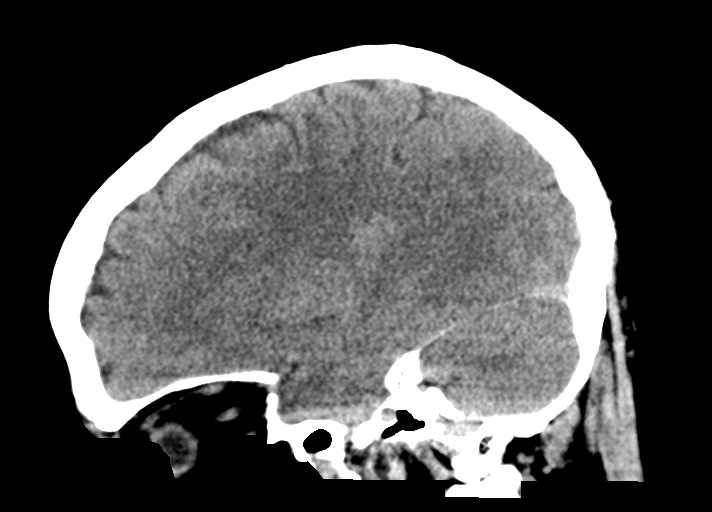

[16 of 47 positions shown; findings below may reference images not displayed]

FINDINGS: Brain: The ventricles and sulci appropriate size for patient's age.
The gray-white matter discrimination is preserved. There is no acute
intracranial hemorrhage. No mass effect or midline shift. No
extra-axial fluid collection.

Vascular: No hyperdense vessel or unexpected calcification.

Skull: Normal. Negative for fracture or focal lesion.

Sinuses/Orbits: No acute finding.

Other: None
IMPRESSION: Unremarkable noncontrast CT of the brain.

## 2021-02-26 ENCOUNTER — Ambulatory Visit (INDEPENDENT_AMBULATORY_CARE_PROVIDER_SITE_OTHER): Payer: BC Managed Care – PPO | Admitting: Family Medicine

## 2021-02-26 ENCOUNTER — Encounter: Payer: Self-pay | Admitting: Family Medicine

## 2021-02-26 ENCOUNTER — Other Ambulatory Visit: Payer: Self-pay

## 2021-02-26 ENCOUNTER — Ambulatory Visit: Payer: BC Managed Care – PPO | Admitting: Family Medicine

## 2021-02-26 VITALS — BP 138/82 | HR 64 | Temp 97.8°F | Ht 75.0 in | Wt 191.8 lb

## 2021-02-26 DIAGNOSIS — R202 Paresthesia of skin: Secondary | ICD-10-CM | POA: Diagnosis not present

## 2021-02-26 MED ORDER — DICLOFENAC SODIUM 75 MG PO TBEC: 75.0000 mg | DELAYED_RELEASE_TABLET | Freq: Two times a day (BID) | ORAL | 0 refills | Status: DC

## 2021-02-26 NOTE — Patient Instructions (Signed)
It was very nice to see you today!  You have tarsal tunnel syndrome.  Please start the anti-inflammatory.  Let me know if not improving in the next 1 to 2 weeks.  Take care, Dr Jerline Pain  PLEASE NOTE:  If you had any lab tests please let us know if you have not heard back within a few days. You may see your results on mychart before we have a chance to review them but we will give you a call once they are reviewed by [place]. If we ordered any referrals today, please let us know if you have not heard from their office within the next week.   Please try these tips to maintain a healthy lifestyle:  Eat at least 3 REAL meals and 1-2 snacks per day.  Aim for no more than 5 hours between eating.  If you eat breakfast, please do so within one hour of getting up.   Each meal should contain half fruits/vegetables, one quarter protein, and one quarter carbs (no bigger than a computer mouse)  Cut down on sweet beverages. This includes juice, soda, and sweet tea.   Drink at least 1 glass of water with each meal and aim for at least 8 glasses per day  Exercise at least 150 minutes every week.    Tarsal Tunnel Syndrome Rehab Ask your health care provider which exercises are safe for you. Do exercises exactly as told by your health care provider and adjust them as directed. It is normal to feel mild stretching, pulling, tightness, or discomfort as you do these exercises. Stop right away if you feel sudden pain or your pain gets worse. Do not begin these exercises until told by your health care provider. Stretching and range-of-motion exercises These exercises warm up your muscles and joints and improve the movement and flexibility of your foot. These exercises also help to relieve pain, numbness, and tingling. Gastrocnemius stretch, standing This exercise is also called a calf stretch. It stretches the muscles in the back of the lower leg (gastrocnemius). Stand with your hands against a wall. Extend your  left / right leg behind you, and bend your front knee slightly. Your heels should be on the floor. Keeping your heels on the floor and your back knee straight, shift your weight toward the wall. Do not arch your back. You should feel a gentle stretch in the back of your lower leg (calf). Hold this position for __________ seconds. Return to the starting position. Repeat __________ times. Complete this exercise __________ times a day. Tibial nerve glide Sit on a stable chair with both feet on the floor. Clasp your hands together behind your back. Gently round your back and tuck your chin toward your chest. Slowly straighten your knee as far as you can without increasing your symptoms. Turn your left / right foot so that your toes are pointing outward. Slowly tip your toes toward your shin. Hold this position for __________ seconds. Slowly return to the starting position. Repeat __________ times. Complete this exercise __________ times a day. Strengthening exercises These exercises build strength and endurance in your foot. Endurance is the ability to use your muscles for a long time, even after they get tired. Plantar flexion with band  Sit on the floor with your left / right leg extended. Loop a rubber exercise band or tube around the ball of your left / right foot. The ball of your foot is on the walking surface, right under your toes. The band or  tube should be slightly tense when your foot is relaxed. Hold the two ends of the band or tube in your hands. Slowly point your toes downward, pushing them away from you (plantar flexion). Stop pushing your toes down if you have any pain. Hold this position for __________ seconds. Let the band or tube slowly pull your foot back to the starting position. Repeat __________ times. Complete this exercise __________ times a day. Ankle inversion with band Secure one end of an exercise band or tubing to a fixed object, such as a table leg or a pole, that  will stay still when the band is pulled. Secure the other end of the band around your left / right foot, near your toes. Sit on the floor, facing the fixed object. The band should be slightly tense when your foot is relaxed. Make fists with your hands and put them between your knees. This will focus your strengthening at your ankle. Leading with your big toe, slowly pull your banded foot inward, toward your body (inversion). The band or tube should be adding resistance. Hold this position for __________ seconds. Let the band or tube slowly pull your foot back to the starting position. Repeat __________ times. Complete this exercise __________ times a day. Arch lifts This exercise strengthens the main muscles of your foot (foot intrinsics). Sit in a chair with your feet flat on the floor. Keeping your big toe and your heel on the floor, lift only your arch, which is on the inner edge of your left / right foot. Do not move your knee or scrunch your toes. This is a small movement. Hold this position for __________ seconds. Slowly return to the starting position. Repeat __________ times. Complete this exercise __________ times a day. This information is not intended to replace advice given to you by your health care provider. Make sure you discuss any questions you have with your health care provider. Document Revised: 05/08/2020 Document Reviewed: 05/08/2020 Elsevier Patient Education  [employer].

## 2021-02-26 NOTE — Progress Notes (Signed)
° °  Brent Reid is a 56 y.o. male who presents today for an office visit.  Assessment/Plan:  Paresthesias Likely tarsal tunnel syndrome given positive Tinel sign on exam.  Has had metabolic work-up with outside lab which was reassuring.  We discussed conservative measures.  We will start diclofenac 75 mg twice daily for the next 1 to 2 weeks.  He will let me know if not improving.  He will follow-up with sports medicine in that case.  Exercise program handout was given.  We discussed reasons return to care.    Subjective:  HPI:  He is here with bilateral foot numbness. This has going on for a year. This started in the great toe and then moved to the feet. Comes and goes. He notes pain has improved in the past few weeks. He still have have numbness in great toe. Feels like pins and needles. He notes right foot is worse then the left. He recently had blood work which showed low vitamin D diefiency and B 12 diefiency. Sugars are in the 100's.  He plays sport and takes Diclofenac whenever he had flare up. He takes this once  a week. He has sensation feeling present when touching. He was concerned he might be allergic to certain food. Had allergy test which was negative. He recently started on keto diet.  No back pain. Denies fever, nausea or chills.         Objective:  Physical Exam: BP 138/82    Pulse 64    Temp 97.8 F (36.6 C) (Temporal)    Ht 6\' 3"  (1.905 m)    Wt 191 lb 12.8 oz (87 kg)    SpO2 98%    BMI 23.97 kg/m   Gen: No acute distress, resting comfortably CV: Regular rate and rhythm with no murmurs appreciated Pulm: Normal work of breathing, clear to auscultation bilaterally with no crackles, wheezes, or rhonchi MSK: Feet without abnormality.  Neurovascular intact distally.  Positive Tinel's sign at bilateral medial malleoli Neuro: Grossly normal, moves all extremities Psych: Normal affect and thought content       I,Savera Zaman,acting as a scribe for , MD.,have  documented all relevant documentation on the behalf of Jacquiline Doe, MD,as directed by  Jacquiline Doe, MD while in the presence of Jacquiline Doe, MD.   I, Jacquiline Doe, MD, have reviewed all documentation for this visit. The documentation on 02/26/21 for the exam, diagnosis, procedures, and orders are all accurate and complete.  02/28/21. Katina Degree, MD 02/26/2021 11:23 AM

## 2021-03-27 ENCOUNTER — Encounter: Payer: Self-pay | Admitting: Family Medicine

## 2021-03-27 DIAGNOSIS — I10 Essential (primary) hypertension: Secondary | ICD-10-CM

## 2021-03-30 MED ORDER — DEXCOM G6 RECEIVER DEVI: 5 refills | Status: AC

## 2021-03-30 MED ORDER — DEXCOM G6 SENSOR MISC: 5 refills | Status: AC

## 2021-03-30 NOTE — Telephone Encounter (Signed)
Please advise 

## 2021-04-02 DIAGNOSIS — G5753 Tarsal tunnel syndrome, bilateral lower limbs: Secondary | ICD-10-CM | POA: Diagnosis not present

## 2021-04-03 ENCOUNTER — Telehealth: Payer: Self-pay | Admitting: *Deleted

## 2021-04-03 NOTE — Telephone Encounter (Signed)
Key: [id number] - Rx #: [id number] ?Drug ?Dexcom G6 Sensor ?Patient notified, insurance will not cover  ?Patient stated getting other resource to used this Rx  ?

## 2021-04-20 DIAGNOSIS — S43432D Superior glenoid labrum lesion of left shoulder, subsequent encounter: Secondary | ICD-10-CM | POA: Diagnosis not present

## 2021-05-13 ENCOUNTER — Other Ambulatory Visit: Payer: BC Managed Care – PPO

## 2021-06-15 ENCOUNTER — Ambulatory Visit (INDEPENDENT_AMBULATORY_CARE_PROVIDER_SITE_OTHER)
Admission: RE | Admit: 2021-06-15 | Discharge: 2021-06-15 | Disposition: A | Discharge status: Home / Self Care | Payer: Self-pay | Source: Ambulatory Visit | Attending: Family Medicine | Admitting: Family Medicine

## 2021-06-15 DIAGNOSIS — I10 Essential (primary) hypertension: Secondary | ICD-10-CM

## 2021-06-17 NOTE — Progress Notes (Signed)
Please inform patient of the following: ? ?His heart scan showed more calcification in his heart and would be expected for someone his age.  He probably needs to be started on a cholesterol medication though recommend he follow-up with his cardiologist to discuss. ? ?He can come in here to have cholesterol levels done first a few issues however he needs to follow-up with cardiology soon.

## 2021-06-18 ENCOUNTER — Inpatient Hospital Stay: Admission: RE | Admit: 2021-06-18 | Payer: BC Managed Care – PPO | Source: Ambulatory Visit

## 2021-06-19 LAB — BASIC METABOLIC PANEL
BUN: 16 (ref 4–21)
CO2: 28 — AB (ref 13–22)
Chloride: 102 (ref 99–108)
Creatinine: 1 (ref 0.6–1.3)
Glucose: 83
Potassium: 5.1 mEq/L (ref 3.5–5.1)
Sodium: 140 (ref 137–147)

## 2021-06-19 LAB — HEPATIC FUNCTION PANEL
ALT: 20 U/L (ref 10–40)
AST: 20 (ref 14–40)
Alkaline Phosphatase: 57 (ref 25–125)
Bilirubin, Total: 0.8

## 2021-06-19 LAB — CBC AND DIFFERENTIAL
HCT: 44 (ref 41–53)
Hemoglobin: 15 (ref 13.5–17.5)
Neutrophils Absolute: 1.6
Platelets: 262 10*3/uL (ref 150–400)
WBC: 3.3

## 2021-06-19 LAB — LIPID PANEL
Cholesterol: 196 (ref 0–200)
HDL: 88 — AB (ref 35–70)
LDL Cholesterol: 93
Triglycerides: 82 (ref 40–160)

## 2021-06-19 LAB — CBC: RBC: 4.95 (ref 3.87–5.11)

## 2021-06-19 LAB — COMPREHENSIVE METABOLIC PANEL
Albumin: 4.7 (ref 3.5–5.0)
Calcium: 9.4 (ref 8.7–10.7)
Globulin: 2.1
eGFR: 90

## 2021-06-19 LAB — HEMOGLOBIN A1C: Hemoglobin A1C: 5

## 2021-06-19 LAB — TSH: TSH: 1.26 (ref 0.41–5.90)

## 2021-06-24 ENCOUNTER — Encounter: Payer: Self-pay | Admitting: Family Medicine

## 2021-06-24 ENCOUNTER — Ambulatory Visit (INDEPENDENT_AMBULATORY_CARE_PROVIDER_SITE_OTHER): Payer: BC Managed Care – PPO | Admitting: Family Medicine

## 2021-06-24 VITALS — BP 156/87 | HR 61 | Temp 95.3°F | Ht 75.0 in | Wt 188.4 lb

## 2021-06-24 DIAGNOSIS — R202 Paresthesia of skin: Secondary | ICD-10-CM | POA: Diagnosis not present

## 2021-06-24 DIAGNOSIS — E785 Hyperlipidemia, unspecified: Secondary | ICD-10-CM

## 2021-06-24 DIAGNOSIS — R931 Abnormal findings on diagnostic imaging of heart and coronary circulation: Secondary | ICD-10-CM

## 2021-06-24 DIAGNOSIS — I10 Essential (primary) hypertension: Secondary | ICD-10-CM

## 2021-06-24 DIAGNOSIS — R030 Elevated blood-pressure reading, without diagnosis of hypertension: Secondary | ICD-10-CM | POA: Insufficient documentation

## 2021-06-24 MED ORDER — SIMVASTATIN 10 MG PO TABS: 10.0000 mg | ORAL_TABLET | Freq: Every day | ORAL | 3 refills | Status: DC

## 2021-06-24 NOTE — Progress Notes (Signed)
   Brent Reid is a 56 y.o. male who presents today for an office visit.  Assessment/Plan:  Chronic Problems Addressed Today: Elevated coronary artery calcium score I had a very lengthy discussion with patient regarding his recent cardiac CT scan.  He is working very hard on diet and exercise and is frustrated with the recent increase in his numbers.  He recently had lipid panel done which showed well-controlled lipids with HDL of 88 and LDL of 93.  We discussed that would be reasonable to try to get his LDL less than 70.  He is very reluctant to start statin medication however may be open to this.  He would like to start with the lowest dose possible.  We will send in a prescription for simvastatin 10 mg daily and he will think about starting this.  We also discussed importance of high-fiber intake.  We can repeat his lipid panel in about 6 months.  We can repeat cardiac CT scan in a couple of years if he wishes.  Paresthesia Still significantly bothersome.  He tried gabapentin from his orthopedist without much improvement.  Concern for possible tarsal tunnel syndrome versus lumbar radiculopathy versus spinal stenosis.  May need an EMG or MRI.  We will place referral to sports medicine for further evaluation and management.  Elevated blood pressure reading Elevated today though typically well controlled.  Has been under a lot of stress recently which could be contributing as well.  Continue home monitoring goal 140/90 or lower and he will let me know if persistently elevated.  Dyslipidemia He has done a great job with lifestyle modifications and his lipid panel seems to be well controlled though he did have increased calcium buildup in his arteries on recent cardiac CT scan.  We discussed importance of high-fiber diet and fiber supplementation.  He is reluctant to start statin however may be open to this.  Would be reasonable to try to get his LDL less than 70.  We will try simvastatin 10 mg  daily.  We can recheck lipids in a few months.     Subjective:  HPI:  See A/p for status of chronic conditions.  Patient recently had a cardiac CT scan that showed elevated calcium score 97 percentile.  He is very concerned about this and would like to discuss his treatment options.       Objective:  Physical Exam: BP (!) 147/90   Pulse 61   Temp (!) 95.3 F (35.2 C) (Temporal)   Ht 6\' 3"  (1.905 m)   Wt 188 lb 6.4 oz (85.5 kg)   SpO2 99%   BMI 23.55 kg/m   Gen: No acute distress, resting comfortably Neuro: Grossly normal, moves all extremities Psych: Normal affect and thought content  Time Spent: 45 minutes of total time was spent on the date of the encounter performing the following actions: chart review prior to seeing the patient, obtaining history, performing a medically necessary exam, counseling on the treatment plan including diet and referrals, placing orders, and documenting in our EHR.        . Katina Degree, MD 06/24/2021 9:00 AM

## 2021-06-24 NOTE — Assessment & Plan Note (Signed)
Still significantly bothersome.  He tried gabapentin from his orthopedist without much improvement.  Concern for possible tarsal tunnel syndrome versus lumbar radiculopathy versus spinal stenosis.  May need an EMG or MRI.  We will place referral to sports medicine for further evaluation and management.

## 2021-06-24 NOTE — Assessment & Plan Note (Signed)
Elevated today though typically well controlled.  Has been under a lot of stress recently which could be contributing as well.  Continue home monitoring goal 140/90 or lower and he will let me know if persistently elevated.

## 2021-06-24 NOTE — Assessment & Plan Note (Signed)
He has done a great job with lifestyle modifications and his lipid panel seems to be well controlled though he did have increased calcium buildup in his arteries on recent cardiac CT scan.  We discussed importance of high-fiber diet and fiber supplementation.  He is reluctant to start statin however may be open to this.  Would be reasonable to try to get his LDL less than 70.  We will try simvastatin 10 mg daily.  We can recheck lipids in a few months.

## 2021-06-24 NOTE — Assessment & Plan Note (Signed)
I had a very lengthy discussion with patient regarding his recent cardiac CT scan.  He is working very hard on diet and exercise and is frustrated with the recent increase in his numbers.  He recently had lipid panel done which showed well-controlled lipids with HDL of 88 and LDL of 93.  We discussed that would be reasonable to try to get his LDL less than 70.  He is very reluctant to start statin medication however may be open to this.  He would like to start with the lowest dose possible.  We will send in a prescription for simvastatin 10 mg daily and he will think about starting this.  We also discussed importance of high-fiber intake.  We can repeat his lipid panel in about 6 months.  We can repeat cardiac CT scan in a couple of years if he wishes.

## 2021-06-24 NOTE — Patient Instructions (Addendum)
It was very nice to see you today!  I will refer you to see sports medicine for the numbness and tingling.   I will send in simvastatin.  We should try to get your LDL less than 70.  Please make sure you are increasing the fiber in your diet.  We can recheck your cholesterol numbers in about 6 months or so.  Take care, Dr Jimmey Ralph  PLEASE NOTE:  If you had any lab tests please let us know if you have not heard back within a few days. You may see your results on mychart before we have a chance to review them but we will give you a call once they are reviewed by [place]. If we ordered any referrals today, please let us know if you have not heard from their office within the next week.   Please try these tips to maintain a healthy lifestyle:  Eat at least 3 REAL meals and 1-2 snacks per day.  Aim for no more than 5 hours between eating.  If you eat breakfast, please do so within one hour of getting up.   Each meal should contain half fruits/vegetables, one quarter protein, and one quarter carbs (no bigger than a computer mouse)  Cut down on sweet beverages. This includes juice, soda, and sweet tea.   Drink at least 1 glass of water with each meal and aim for at least 8 glasses per day  Exercise at least 150 minutes every week.

## 2021-07-06 NOTE — Progress Notes (Unsigned)
    Aleen Sells D.[facility] [address] [place] [address] Phone: [phone]   Assessment and Plan:     There are no diagnoses linked to this encounter.  ***   Pertinent previous records reviewed include ***   Follow Up: ***     Subjective:   I, Kyeshia Zinn, am serving as a Neurosurgeon for Doctor Richardean Sale  Chief Complaint: paresthesia   HPI:   07/07/2021 Patient is a 56 year old male complaining of paresthesia. Patient states  Relevant Historical Information: ***  Additional pertinent review of systems negative.   Current Outpatient Medications:    Continuous Blood Gluc Receiver (DEXCOM G6 RECEIVER) DEVI, Use daily as needed to check blood sugar., Disp: 1 each, Rfl: 5   Continuous Blood Gluc Sensor (DEXCOM G6 SENSOR) MISC, Use daily as needed to check blood sugar., Disp: 1 each, Rfl: 5   diclofenac (VOLTAREN) 75 MG EC tablet, Take 1 tablet (75 mg total) by mouth 2 (two) times daily., Disp: 30 tablet, Rfl: 0   simvastatin (ZOCOR) 10 MG tablet, Take 1 tablet (10 mg total) by mouth at bedtime., Disp: 90 tablet, Rfl: 3   Objective:     There were no vitals filed for this visit.    There is no height or weight on file to calculate BMI.    Physical Exam:    ***   Electronically signed by:  Aleen Sells D.[facility] 1:04 PM 07/06/21

## 2021-07-07 ENCOUNTER — Ambulatory Visit (INDEPENDENT_AMBULATORY_CARE_PROVIDER_SITE_OTHER): Payer: BC Managed Care – PPO | Admitting: Sports Medicine

## 2021-07-07 ENCOUNTER — Ambulatory Visit (INDEPENDENT_AMBULATORY_CARE_PROVIDER_SITE_OTHER): Payer: BC Managed Care – PPO

## 2021-07-07 VITALS — BP 100/80 | Ht 75.0 in | Wt 188.0 lb

## 2021-07-07 DIAGNOSIS — M5442 Lumbago with sciatica, left side: Secondary | ICD-10-CM | POA: Diagnosis not present

## 2021-07-07 DIAGNOSIS — M545 Low back pain, unspecified: Secondary | ICD-10-CM

## 2021-07-07 DIAGNOSIS — M5136 Other intervertebral disc degeneration, lumbar region: Secondary | ICD-10-CM | POA: Diagnosis not present

## 2021-07-07 DIAGNOSIS — M5441 Lumbago with sciatica, right side: Secondary | ICD-10-CM | POA: Diagnosis not present

## 2021-07-07 DIAGNOSIS — G8929 Other chronic pain: Secondary | ICD-10-CM

## 2021-07-07 NOTE — Patient Instructions (Addendum)
Good to see you Epidural left L4-L5 2 week follow up after your injection to discuss your results

## 2021-07-09 ENCOUNTER — Ambulatory Visit
Admission: RE | Admit: 2021-07-09 | Discharge: 2021-07-09 | Disposition: A | Discharge status: Home / Self Care | Payer: BC Managed Care – PPO | Source: Ambulatory Visit | Attending: Sports Medicine | Admitting: Sports Medicine

## 2021-07-09 DIAGNOSIS — G5793 Unspecified mononeuropathy of bilateral lower limbs: Secondary | ICD-10-CM | POA: Diagnosis not present

## 2021-07-09 DIAGNOSIS — G8929 Other chronic pain: Secondary | ICD-10-CM

## 2021-07-09 MED ORDER — METHYLPREDNISOLONE ACETATE 40 MG/ML INJ SUSP (RADIOLOG
80.0000 mg | Freq: Once | INTRAMUSCULAR | Status: AC
  Administered 2021-07-09: 80 mg via EPIDURAL

## 2021-07-09 MED ORDER — IOPAMIDOL (ISOVUE-M 200) INJECTION 41%
1.0000 mL | Freq: Once | INTRAMUSCULAR | Status: AC
  Administered 2021-07-09: 1 mL via EPIDURAL

## 2021-07-09 NOTE — Discharge Instructions (Signed)

## 2021-07-20 DIAGNOSIS — M9902 Segmental and somatic dysfunction of thoracic region: Secondary | ICD-10-CM | POA: Diagnosis not present

## 2021-07-20 DIAGNOSIS — M7542 Impingement syndrome of left shoulder: Secondary | ICD-10-CM | POA: Diagnosis not present

## 2021-07-20 DIAGNOSIS — M9907 Segmental and somatic dysfunction of upper extremity: Secondary | ICD-10-CM | POA: Diagnosis not present

## 2021-07-20 DIAGNOSIS — M9901 Segmental and somatic dysfunction of cervical region: Secondary | ICD-10-CM | POA: Diagnosis not present

## 2021-07-20 DIAGNOSIS — M47892 Other spondylosis, cervical region: Secondary | ICD-10-CM | POA: Diagnosis not present

## 2021-07-28 DIAGNOSIS — M47892 Other spondylosis, cervical region: Secondary | ICD-10-CM | POA: Diagnosis not present

## 2021-07-28 DIAGNOSIS — M9901 Segmental and somatic dysfunction of cervical region: Secondary | ICD-10-CM | POA: Diagnosis not present

## 2021-07-28 DIAGNOSIS — M9902 Segmental and somatic dysfunction of thoracic region: Secondary | ICD-10-CM | POA: Diagnosis not present

## 2021-07-28 DIAGNOSIS — M9907 Segmental and somatic dysfunction of upper extremity: Secondary | ICD-10-CM | POA: Diagnosis not present

## 2021-07-30 ENCOUNTER — Encounter: Payer: Self-pay | Admitting: Family Medicine

## 2021-07-31 NOTE — Telephone Encounter (Signed)
Can you help him with this?

## 2021-08-06 NOTE — Telephone Encounter (Signed)
Can you help me with this please  

## 2021-10-13 DIAGNOSIS — M25511 Pain in right shoulder: Secondary | ICD-10-CM | POA: Diagnosis not present

## 2021-10-26 ENCOUNTER — Encounter: Payer: Self-pay | Admitting: *Deleted

## 2021-11-02 ENCOUNTER — Encounter: Payer: Self-pay | Admitting: Family Medicine

## 2021-12-07 DIAGNOSIS — M9901 Segmental and somatic dysfunction of cervical region: Secondary | ICD-10-CM | POA: Diagnosis not present

## 2021-12-07 DIAGNOSIS — M47892 Other spondylosis, cervical region: Secondary | ICD-10-CM | POA: Diagnosis not present

## 2021-12-07 DIAGNOSIS — M9907 Segmental and somatic dysfunction of upper extremity: Secondary | ICD-10-CM | POA: Diagnosis not present

## 2021-12-07 DIAGNOSIS — M9902 Segmental and somatic dysfunction of thoracic region: Secondary | ICD-10-CM | POA: Diagnosis not present

## 2022-01-04 ENCOUNTER — Ambulatory Visit: Payer: BC Managed Care – PPO | Admitting: Family Medicine

## 2022-01-14 ENCOUNTER — Encounter: Payer: Self-pay | Admitting: *Deleted

## 2022-11-30 ENCOUNTER — Ambulatory Visit (INDEPENDENT_AMBULATORY_CARE_PROVIDER_SITE_OTHER): Payer: BC Managed Care – PPO | Admitting: Family Medicine

## 2022-11-30 ENCOUNTER — Encounter: Payer: Self-pay | Admitting: Family Medicine

## 2022-11-30 VITALS — BP 147/82 | HR 69 | Temp 97.8°F | Ht 75.0 in | Wt 203.6 lb

## 2022-11-30 DIAGNOSIS — I10 Essential (primary) hypertension: Secondary | ICD-10-CM

## 2022-11-30 DIAGNOSIS — E785 Hyperlipidemia, unspecified: Secondary | ICD-10-CM | POA: Diagnosis not present

## 2022-11-30 DIAGNOSIS — R8290 Unspecified abnormal findings in urine: Secondary | ICD-10-CM | POA: Diagnosis not present

## 2022-11-30 DIAGNOSIS — R7989 Other specified abnormal findings of blood chemistry: Secondary | ICD-10-CM | POA: Diagnosis not present

## 2022-11-30 DIAGNOSIS — R5381 Other malaise: Secondary | ICD-10-CM

## 2022-11-30 MED ORDER — CIPROFLOXACIN HCL 500 MG PO TABS: 500.0000 mg | ORAL_TABLET | Freq: Two times a day (BID) | ORAL | 0 refills | Status: AC

## 2022-11-30 NOTE — Addendum Note (Signed)
Addended by: Lorn Junes on: 11/30/2022 10:32 AM   Modules accepted: Orders

## 2022-11-30 NOTE — Assessment & Plan Note (Signed)
Above goal.  No longer on any antihypertensives.  Has previously been well-controlled.  May be elevated in setting of acute illness.  Will continue to monitor for now.  He will let us know if persistently elevated and we can restart antihypertensive as needed.

## 2022-11-30 NOTE — Assessment & Plan Note (Signed)
Incidentally for wound on recent labs.  He will have this rechecked via his third-party again in a few months.  If persistently elevated will need to be referred to endocrinology.

## 2022-11-30 NOTE — Progress Notes (Signed)
   Brent Reid is a 57 y.o. male who presents today for an office visit.  Assessment/Plan:  New/Acute Problems: Malaise / Positive leukocyte esterase and ketones on UA No red flag signs or symptoms.  The labs that he had performed at an outside lab which were notable for an abnormal UA with positive leuk esterase and positive ketones.  Also had CRP of 3.9.  He is having some urinary symptoms as well.  No signs of pyelonephritis or systemic illness though his symptoms and lab findings are consistent with possible urinary tract infection.  Will empirically start Cipro 500 mg twice daily while we await urine culture results.  This will hopefully cover for developing pyelonephritis, simple cystitis, as well as prostatitis.  We encouraged hydration.  He will follow-up with [place] in a few days if not improving. We did discuss obtaining PSA however he prefers to have this done through his third-party and will get this done with him soon.   Chronic Problems Addressed Today: Dyslipidemia Mildly elevated LDL on recent labs.  He is longer on statin.  He is working on lifestyle modifications.  He will recheck again in a few months.  High serum testosterone Incidentally for wound on recent labs.  He will have this rechecked via his third-party again in a few months.  If persistently elevated will need to be referred to endocrinology.  Essential hypertension Above goal.  No longer on any antihypertensives.  Has previously been well-controlled.  May be elevated in setting of acute illness.  Will continue to monitor for now.  He will let us know if persistently elevated and we can restart antihypertensive as needed.     Subjective:  HPI:  See Assessment / plan for status of chronic conditions. Patient here with malaise for the last couple of weeks.  He has had some congestion as well.  He contacted a third-party to get lab work done and he had extensive labs done.  CBC and c-Met were normal however his  urinalysis showed trace leukocyte esterase and trace ketones.  Nitrites were negative.  He does feel like he has had a little more discomfort when holding his urination.  No overt dysuria.  No increased frequency.  No reported hematuria.  No flank pain.  No reported fevers or chills.  His congestion is improving.  He also noted to have an elevated CRP at 3.9 on labs.  He is worried about an infection and would like to get started on antibiotics.  He was also found to have elevated testosterone in the 900 range as well as LDL of 106 on his most recent labs.       Objective:  Physical Exam: BP (!) 147/82   Pulse 69   Temp 97.8 F (36.6 C) (Temporal)   Ht 6\' 3"  (1.905 m)   Wt 203 lb 9.6 oz (92.4 kg)   SpO2 97%   BMI 25.45 kg/m   Wt Readings from Last 3 Encounters:  11/30/22 203 lb 9.6 oz (92.4 kg)  07/07/21 188 lb (85.3 kg)  06/24/21 188 lb 6.4 oz (85.5 kg)  Gen: No acute distress, resting comfortably CV: Regular rate and rhythm with no murmurs appreciated Pulm: Normal work of breathing, clear to auscultation bilaterally with no crackles, wheezes, or rhonchi MUSCULOSKELETAL: No CVA tenderness Neuro: Grossly normal, moves all extremities Psych: Normal affect and thought content      Teal Bontrager M. Jimmey Ralph, MD 11/30/2022 10:17 AM

## 2022-11-30 NOTE — Assessment & Plan Note (Signed)
Mildly elevated LDL on recent labs.  He is longer on statin.  He is working on lifestyle modifications.  He will recheck again in a few months.

## 2022-11-30 NOTE — Patient Instructions (Signed)
It was very nice to see you today!  You may have a near tract infection.  Please start the antibiotic.  Please make sure that you are getting plenty of fluids.  We will check a urine culture today.  Let us know if your symptoms are not improving.  Return if symptoms worsen or fail to improve.   Take care, Dr Jimmey Ralph  PLEASE NOTE:  If you had any lab tests, please let us know if you have not heard back within a few days. You may see your results on mychart before we have a chance to review them but we will give you a call once they are reviewed by [place].   If we ordered any referrals today, please let us know if you have not heard from their office within the next week.   If you had any urgent prescriptions sent in today, please check with the pharmacy within an hour of our visit to make sure the prescription was transmitted appropriately.   Please try these tips to maintain a healthy lifestyle:  Eat at least 3 REAL meals and 1-2 snacks per day.  Aim for no more than 5 hours between eating.  If you eat breakfast, please do so within one hour of getting up.   Each meal should contain half fruits/vegetables, one quarter protein, and one quarter carbs (no bigger than a computer mouse)  Cut down on sweet beverages. This includes juice, soda, and sweet tea.   Drink at least 1 glass of water with each meal and aim for at least 8 glasses per day  Exercise at least 150 minutes every week.

## 2022-12-01 LAB — URINE CULTURE
MICRO NUMBER:: 15657830
Result:: NO GROWTH
SPECIMEN QUALITY:: ADEQUATE

## 2022-12-03 NOTE — Progress Notes (Signed)
His urine culture is negative. Sometimes prostate infections do not show up on urine cultures. If he is improving he can finish his course of antibiotics however if he is not improving he should follow up here to look for other possible causes.  Katina Degree. Jimmey Ralph, MD 12/03/2022 7:39 AM

## 2022-12-06 ENCOUNTER — Encounter: Payer: Self-pay | Admitting: Family Medicine

## 2022-12-06 ENCOUNTER — Other Ambulatory Visit: Payer: Self-pay | Admitting: *Deleted

## 2022-12-06 DIAGNOSIS — Z1211 Encounter for screening for malignant neoplasm of colon: Secondary | ICD-10-CM

## 2022-12-06 NOTE — Telephone Encounter (Signed)
Spoke with patient requesting colonoscopy referral due to his insurance  GI referral placed

## 2023-01-06 ENCOUNTER — Encounter: Payer: Self-pay | Admitting: Gastroenterology

## 2023-01-06 ENCOUNTER — Ambulatory Visit: Payer: BC Managed Care – PPO

## 2023-01-06 VITALS — Ht 75.0 in | Wt 195.0 lb

## 2023-01-06 DIAGNOSIS — Z1211 Encounter for screening for malignant neoplasm of colon: Secondary | ICD-10-CM

## 2023-01-06 MED ORDER — NA SULFATE-K SULFATE-MG SULF 17.5-3.13-1.6 GM/177ML PO SOLN: 1.0000 | Freq: Once | ORAL | 0 refills | Status: AC

## 2023-01-06 NOTE — Progress Notes (Signed)

## 2023-01-13 ENCOUNTER — Encounter: Payer: Self-pay | Admitting: Gastroenterology

## 2023-01-13 ENCOUNTER — Ambulatory Visit: Payer: BC Managed Care – PPO | Admitting: Gastroenterology

## 2023-01-13 VITALS — BP 125/76 | HR 69 | Temp 97.5°F | Resp 28 | Ht 75.0 in | Wt 195.0 lb

## 2023-01-13 DIAGNOSIS — K6389 Other specified diseases of intestine: Secondary | ICD-10-CM | POA: Diagnosis not present

## 2023-01-13 DIAGNOSIS — D123 Benign neoplasm of transverse colon: Secondary | ICD-10-CM | POA: Diagnosis not present

## 2023-01-13 DIAGNOSIS — Z1211 Encounter for screening for malignant neoplasm of colon: Secondary | ICD-10-CM | POA: Diagnosis not present

## 2023-01-13 DIAGNOSIS — K640 First degree hemorrhoids: Secondary | ICD-10-CM | POA: Diagnosis not present

## 2023-01-13 DIAGNOSIS — D120 Benign neoplasm of cecum: Secondary | ICD-10-CM | POA: Diagnosis not present

## 2023-01-13 DIAGNOSIS — K5730 Diverticulosis of large intestine without perforation or abscess without bleeding: Secondary | ICD-10-CM | POA: Diagnosis not present

## 2023-01-13 DIAGNOSIS — D122 Benign neoplasm of ascending colon: Secondary | ICD-10-CM

## 2023-01-13 MED ORDER — SODIUM CHLORIDE 0.9 % IV SOLN: 500.0000 mL | Freq: Once | INTRAVENOUS | Status: DC

## 2023-01-13 NOTE — Progress Notes (Signed)
[facility] History and Physical   Primary Care Physician:  Ardith Dark, MD   Reason for Procedure:   Colon cancer screening  Plan:    Screening colonoscopy     HPI: Brent Reid is a 57 y.o. male undergoing initial average risk screening colonoscopy.  He has no family history of colon cancer and no chronic GI symptoms, although he has recently started experiencing some painless bright red blood per rectum.   History reviewed. No pertinent past medical history.  Past Surgical History:  Procedure Laterality Date   BACK SURGERY     KNEE SURGERY     TONSILLECTOMY      Prior to Admission medications   Medication Sig Start Date End Date Taking? Authorizing Provider  Continuous Blood Gluc Receiver (DEXCOM G6 RECEIVER) DEVI Use daily as needed to check blood sugar. Patient not taking: Reported on 01/13/2023 03/30/21   Ardith Dark, MD  Continuous Blood Gluc Sensor (DEXCOM G6 SENSOR) MISC Use daily as needed to check blood sugar. Patient not taking: Reported on 01/13/2023 03/30/21   Ardith Dark, MD  diclofenac (VOLTAREN) 50 MG EC tablet Take 50 mg by mouth 2 (two) times daily. 12/17/22   [provider]    Current Outpatient Medications  Medication Sig Dispense Refill   Continuous Blood Gluc Receiver (DEXCOM G6 RECEIVER) DEVI Use daily as needed to check blood sugar. (Patient not taking: Reported on 01/13/2023) 1 each 5   Continuous Blood Gluc Sensor (DEXCOM G6 SENSOR) MISC Use daily as needed to check blood sugar. (Patient not taking: Reported on 01/13/2023) 1 each 5   diclofenac (VOLTAREN) 50 MG EC tablet Take 50 mg by mouth 2 (two) times daily.     Current Facility-Administered Medications  Medication Dose Route Frequency Provider Last Rate Last Admin   0.9 %  sodium chloride infusion  500 mL Intravenous Once Jenel Lucks, MD        Allergies as of 01/13/2023   (No Known Allergies)    Family History  Problem Relation Age of Onset    Stroke Mother    Heart failure Father    CAD Father    Colon cancer Neg Hx    Colon polyps Neg Hx    Stomach cancer Neg Hx    Rectal cancer Neg Hx    Esophageal cancer Neg Hx     Social History   Socioeconomic History   Marital status: Married    Spouse name: Not on file   Number of children: 1   Years of education: Not on file   Highest education level: Not on file  Occupational History    Comment: Real Estate  Tobacco Use   Smoking status: Never   Smokeless tobacco: Never  Vaping Use   Vaping status: Never Used  Substance and Sexual Activity   Alcohol use: Yes    Comment: Occasional   Drug use: Never   Sexual activity: Not on file  Other Topics Concern   Not on file  Social History Narrative   Not on file   Social Drivers of Health   Financial Resource Strain: Not on file  Food Insecurity: Not on file  Transportation Needs: Not on file  Physical Activity: Not on file  Stress: Not on file  Social Connections: Unknown (06/16/2021)   Received from [facility], [facility]   Social Network    Social Network: Not on file  Intimate Partner Violence: Unknown (05/08/2021)   Received from [facility], [facility]   HITS    Physically Hurt: Not on file    Insult or Talk Down To: Not on file    Threaten Physical Harm: Not on file    Scream or Curse: Not on file    Review of Systems:  All other review of systems negative except as mentioned in the HPI.  Physical Exam: Vital signs BP (!) 148/93   Pulse 74   Temp (!) 97.5 F (36.4 C) (Temporal)   Resp (!) 74   Ht 6\' 3"  (1.905 m)   Wt 195 lb (88.5 kg)   SpO2 98%   BMI 24.37 kg/m   General:   Alert,  Well-developed, well-nourished, pleasant and cooperative in NAD Airway:  Mallampati 1 Lungs:  Clear throughout to auscultation.   Heart:  Regular rate and rhythm; no murmurs, clicks, rubs,  or gallops. Abdomen:  Soft, nontender and nondistended. Normal bowel sounds.   Neuro/Psych:  Normal mood and  affect. A and O x 3   [name] E. Tomasa Rand, MD [facility]

## 2023-01-13 NOTE — Op Note (Signed)
[facility] Patient Name: Brent Reid Procedure Date: 01/13/2023 2:18 PM MRN: [id number] Endoscopist: Lorin Picket E. Tomasa Rand , MD, [id number] Age: 57 Referring MD:  Date of Birth: 1966-01-03 Gender: Male Account #: [id number] Procedure:                Colonoscopy Indications:              Screening for colorectal malignant neoplasm, This                            is the patient's first colonoscopy Medicines:                Monitored Anesthesia Care Procedure:                Pre-Anesthesia Assessment:                           - Prior to the procedure, a History and Physical                            was performed, and patient medications and                            allergies were reviewed. The patient's tolerance of                            previous anesthesia was also reviewed. The risks                            and benefits of the procedure and the sedation                            options and risks were discussed with the patient.                            All questions were answered, and informed consent                            was obtained. Prior Anticoagulants: The patient has                            taken no anticoagulant or antiplatelet agents. ASA                            Grade Assessment: I - A normal, healthy patient.                            After reviewing the risks and benefits, the patient                            was deemed in satisfactory condition to undergo the                            procedure.  After obtaining informed consent, the colonoscope                            was passed under direct vision. Throughout the                            procedure, the patient's blood pressure, pulse, and                            oxygen saturations were monitored continuously. The                            Olympus Scope [id number] was introduced through the                            anus and advanced to the the  cecum, identified by                            appendiceal orifice and ileocecal valve. The                            colonoscopy was somewhat difficult due to                            significant looping. Successful completion of the                            procedure was aided by using manual pressure. The                            patient tolerated the procedure well. The quality                            of the bowel preparation was adequate. The                            ileocecal valve, appendiceal orifice, and rectum                            were photographed. The bowel preparation used was                            SUPREP via split dose instruction. Scope In: 2:26:29 PM Scope Out: 2:49:55 PM Scope Withdrawal Time: 0 hours 18 minutes 59 seconds  Total Procedure Duration: 0 hours 23 minutes 26 seconds  Findings:                 The perianal and digital rectal examinations were                            normal. Pertinent negatives include normal                            sphincter tone and no palpable rectal lesions.  Three sessile polyps were found in the cecum. The                            polyps were 3 to 4 mm in size. These polyps were                            removed with a cold snare. Resection and retrieval                            were complete. Estimated blood loss was minimal.                           A 3 mm polyp was found in the ascending colon. The                            polyp was sessile. The polyp was removed with a                            cold snare. Resection and retrieval were complete.                            Estimated blood loss was minimal.                           A 4 mm polyp was found in the hepatic flexure. The                            polyp was sessile. The polyp was removed with a                            cold snare. Resection and retrieval were complete.                            Estimated blood  loss was minimal.                           Multiple medium-mouthed and small-mouthed                            diverticula were found in the sigmoid colon and                            descending colon.                           A diffuse area of moderate melanosis was found in                            the descending colon, in the transverse colon, in                            the ascending colon and in the cecum.  The exam was otherwise normal throughout the                            examined colon.                           Non-bleeding internal hemorrhoids were found during                            retroflexion. The hemorrhoids were Grade I                            (internal hemorrhoids that do not prolapse).                           No additional abnormalities were found on                            retroflexion. Complications:            No immediate complications. Estimated Blood Loss:     Estimated blood loss was minimal. Impression:               - Three 3 to 4 mm polyps in the cecum, removed with                            a cold snare. Resected and retrieved.                           - One 3 mm polyp in the ascending colon, removed                            with a cold snare. Resected and retrieved.                           - One 4 mm polyp at the hepatic flexure, removed                            with a cold snare. Resected and retrieved.                           - Mild diverticulosis in the sigmoid colon and in                            the descending colon.                           - Melanosis in the colon.                           - Non-bleeding internal hemorrhoids. Recommendation:           - Patient has a contact number available for                            emergencies. The signs and symptoms of potential  delayed complications were discussed with the                            patient. Return to  normal activities tomorrow.                            Written discharge instructions were provided to the                            patient.                           - Resume previous diet.                           - Continue present medications.                           - Await pathology results.                           - Repeat colonoscopy (date not yet determined) for                            surveillance based on pathology results. Axtyn Woehler E. Tomasa Rand, MD 01/13/2023 3:00:37 PM This report has been signed electronically.

## 2023-01-13 NOTE — Progress Notes (Signed)
Pt's states no medical or surgical changes since previsit or office visit. 

## 2023-01-13 NOTE — Progress Notes (Signed)
Sedate, gd SR's, VSS, report to RN 

## 2023-01-13 NOTE — Progress Notes (Signed)
Called to room to assist during endoscopic procedure.  Patient ID and intended procedure confirmed with present staff. Received instructions for my participation in the procedure from the performing physician.  

## 2023-01-13 NOTE — Patient Instructions (Signed)
Handouts Provided:  Polyps and Diverticulosis  YOU HAD AN ENDOSCOPIC PROCEDURE TODAY AT THE  ENDOSCOPY CENTER:   Refer to the procedure report that was given to you for any specific questions about what was found during the examination.  If the procedure report does not answer your questions, please call your gastroenterologist to clarify.  If you requested that your care partner not be given the details of your procedure findings, then the procedure report has been included in a sealed envelope for you to review at your convenience later.  YOU SHOULD EXPECT: Some feelings of bloating in the abdomen. Passage of more gas than usual.  Walking can help get rid of the air that was put into your GI tract during the procedure and reduce the bloating. If you had a lower endoscopy (such as a colonoscopy or flexible sigmoidoscopy) you may notice spotting of blood in your stool or on the toilet paper. If you underwent a bowel prep for your procedure, you may not have a normal bowel movement for a few days.  Please Note:  You might notice some irritation and congestion in your nose or some drainage.  This is from the oxygen used during your procedure.  There is no need for concern and it should clear up in a day or so.  SYMPTOMS TO REPORT IMMEDIATELY:  Following lower endoscopy (colonoscopy or flexible sigmoidoscopy):  Excessive amounts of blood in the stool  Significant tenderness or worsening of abdominal pains  Swelling of the abdomen that is new, acute  Fever of 100F or higher  For urgent or emergent issues, a gastroenterologist can be reached at any hour by calling [phone]. Do not use MyChart messaging for urgent concerns.    DIET:  We do recommend a small meal at first, but then you may proceed to your regular diet.  Drink plenty of fluids but you should avoid alcoholic beverages for 24 hours.  ACTIVITY:  You should plan to take it easy for the rest of today and you should NOT DRIVE  or use heavy machinery until tomorrow (because of the sedation medicines used during the test).    FOLLOW UP: Our staff will call the number listed on your records the next business day following your procedure.  We will call around 7:15- 8:00 am to check on you and address any questions or concerns that you may have regarding the information given to you following your procedure. If we do not reach you, we will leave a message.     If any biopsies were taken you will be contacted by phone or by letter within the next 1-3 weeks.  Please call us at [phone] if you have not heard about the biopsies in 3 weeks.    SIGNATURES/CONFIDENTIALITY: You and/or your care partner have signed paperwork which will be entered into your electronic medical record.  These signatures attest to the fact that that the information above on your After Visit Summary has been reviewed and is understood.  Full responsibility of the confidentiality of this discharge information lies with you and/or your care-partner.  

## 2023-01-14 ENCOUNTER — Telehealth: Payer: Self-pay | Admitting: *Deleted

## 2023-01-14 NOTE — Telephone Encounter (Signed)
  Follow up Call-     01/13/2023    1:48 PM  Call back number  Post procedure Call Back phone  # [phone]  Permission to leave phone message Yes     Patient questions:  Do you have a fever, pain , or abdominal swelling? No. Pain Score  0 *  Have you tolerated food without any problems? Yes.    Have you been able to return to your normal activities? Yes.    Do you have any questions about your discharge instructions: Diet   No. Medications  No. Follow up visit  No.  Do you have questions or concerns about your Care? No.  Actions: * If pain score is 4 or above: No action needed, pain <4.

## 2023-01-18 LAB — SURGICAL PATHOLOGY

## 2023-01-20 NOTE — Progress Notes (Signed)
Brent Reid,   All five polyps that I removed during your recent procedure were completely benign but were proven to be "pre-cancerous" polyps that MAY have grown into cancers if they had not been removed.  Studies shows that at least 20% of women over age 55 and 30% of men over age 22 have pre-cancerous polyps.  Based on current nationally recognized surveillance guidelines, I recommend that you have a repeat colonoscopy in 3 years.   If you develop any new rectal bleeding, abdominal pain or significant bowel habit changes, please contact me before then.

## 2023-04-25 DIAGNOSIS — M7542 Impingement syndrome of left shoulder: Secondary | ICD-10-CM | POA: Diagnosis not present

## 2023-06-20 DIAGNOSIS — M7542 Impingement syndrome of left shoulder: Secondary | ICD-10-CM | POA: Diagnosis not present

## 2023-08-15 DIAGNOSIS — M19012 Primary osteoarthritis, left shoulder: Secondary | ICD-10-CM | POA: Diagnosis not present
